# Patient Record
Sex: Male | Born: 1970 | Race: White | Hispanic: No | Marital: Married | Smoking: Never smoker
Health system: Southern US, Community
[De-identification: ages and names within clinical notes are randomized; demographics above are authoritative.]

## PROBLEM LIST (undated history)

## (undated) ENCOUNTER — Emergency Department (HOSPITAL_COMMUNITY): Admission: EM | Payer: Self-pay | Source: Home / Self Care

---

## 2011-12-13 ENCOUNTER — Ambulatory Visit (INDEPENDENT_AMBULATORY_CARE_PROVIDER_SITE_OTHER): Payer: BC Managed Care – PPO | Admitting: Urology

## 2011-12-13 DIAGNOSIS — N509 Disorder of male genital organs, unspecified: Secondary | ICD-10-CM

## 2012-02-27 ENCOUNTER — Other Ambulatory Visit: Payer: Self-pay | Admitting: Family Medicine

## 2012-02-27 ENCOUNTER — Other Ambulatory Visit: Payer: BC Managed Care – PPO

## 2012-02-27 DIAGNOSIS — M542 Cervicalgia: Secondary | ICD-10-CM

## 2012-02-28 ENCOUNTER — Ambulatory Visit
Admission: RE | Admit: 2012-02-28 | Discharge: 2012-02-28 | Disposition: A | Discharge status: Home / Self Care | Payer: BC Managed Care – PPO | Source: Ambulatory Visit | Attending: Family Medicine | Admitting: Family Medicine

## 2012-02-28 DIAGNOSIS — M542 Cervicalgia: Secondary | ICD-10-CM

## 2013-11-19 ENCOUNTER — Ambulatory Visit (INDEPENDENT_AMBULATORY_CARE_PROVIDER_SITE_OTHER): Payer: BC Managed Care – PPO | Admitting: Family Medicine

## 2013-11-19 ENCOUNTER — Encounter: Payer: Self-pay | Admitting: Family Medicine

## 2013-11-19 VITALS — BP 117/81 | Ht 72.0 in | Wt 165.0 lb

## 2013-11-19 DIAGNOSIS — M25579 Pain in unspecified ankle and joints of unspecified foot: Secondary | ICD-10-CM

## 2013-11-19 DIAGNOSIS — S93409A Sprain of unspecified ligament of unspecified ankle, initial encounter: Secondary | ICD-10-CM

## 2013-11-19 NOTE — Patient Instructions (Signed)
Thank you for coming in today  We are consider that you may have an osteochondral lesion of the talar dome 1. Get ankle xrays 2. Get MRI   We will call with MRI

## 2013-11-19 NOTE — Progress Notes (Signed)
   Subjective:    Patient ID: Scott Romero, male    DOB: February 01, 1971, 43 y.o.   MRN: [id number]  HPI Right ankle pain. About 2 months ago he stepped in a hole, sprained his ankle. Has had previous multiple ankle sprains which he has successfully recapped, this ankle sprain has not returned to baseline. He's been the usual exercises. Still having pain particularly on any type of running or jogging. Some pain with walking and weightbearing. Pain is deep seated within the ankle. Not aware of any swelling.   Review of Systems No fever, no swelling or erythema or warmth of the ankle joint. No numbness or tingling in his foot or toes.    Objective:   Physical Exam Vital signs are reviewed GENERAL: Well-developed male no acute distress to ANKLE: Right. Mild tenderness to palpation on inversion and eversion and the pain seems to be within the subtalar joint. Inversion and eversion strength is 4. Anterior drawer is normal. There is no effusion. ; No significant ankle effusion. HEF in perineal tendons appear to be intact.       Assessment & Plan:  Ankle pain. Given mechanism of injury and lack of improvement with adequate conservative therapy I am concerned that he has costochondral injury. We'll get x-rays and ultimately will likely need MRI.

## 2013-11-24 ENCOUNTER — Ambulatory Visit
Admission: RE | Admit: 2013-11-24 | Discharge: 2013-11-24 | Disposition: A | Discharge status: Home / Self Care | Payer: BC Managed Care – PPO | Source: Ambulatory Visit | Attending: Family Medicine | Admitting: Family Medicine

## 2013-11-24 DIAGNOSIS — S93409A Sprain of unspecified ligament of unspecified ankle, initial encounter: Secondary | ICD-10-CM

## 2013-11-24 DIAGNOSIS — M25579 Pain in unspecified ankle and joints of unspecified foot: Secondary | ICD-10-CM

## 2013-11-25 ENCOUNTER — Telehealth: Payer: Self-pay | Admitting: *Deleted

## 2013-11-25 NOTE — Telephone Encounter (Signed)
Message copied by Daphine DeutscherMARTIN, AMY C on Thu Nov 25, 2013  3:01 PM ------      Message from: Daine GipVOSS, ERIN L      Created: Wed Nov 24, 2013  6:16 PM       Please notify patient that xray is normal. Recommend MRI as scheudled. Thanks ------

## 2013-11-25 NOTE — Telephone Encounter (Signed)
Pt notified of x-ray results, scheduled for MRI 12/01/13.

## 2013-11-26 ENCOUNTER — Other Ambulatory Visit: Payer: BC Managed Care – PPO

## 2013-12-01 ENCOUNTER — Inpatient Hospital Stay: Admission: RE | Admit: 2013-12-01 | Payer: BC Managed Care – PPO | Source: Ambulatory Visit

## 2018-03-08 ENCOUNTER — Inpatient Hospital Stay (HOSPITAL_COMMUNITY)
Admission: EM | Admit: 2018-03-08 | Discharge: 2018-03-12 | DRG: 501 | Disposition: A | Discharge status: Home / Self Care | Payer: BLUE CROSS/BLUE SHIELD | Attending: General Surgery | Admitting: General Surgery

## 2018-03-08 ENCOUNTER — Encounter (HOSPITAL_COMMUNITY): Admission: EM | Disposition: A | Discharge status: Home / Self Care | Payer: Self-pay | Source: Home / Self Care

## 2018-03-08 ENCOUNTER — Emergency Department (HOSPITAL_COMMUNITY): Payer: BLUE CROSS/BLUE SHIELD

## 2018-03-08 ENCOUNTER — Inpatient Hospital Stay (HOSPITAL_COMMUNITY): Payer: BLUE CROSS/BLUE SHIELD | Admitting: Anesthesiology

## 2018-03-08 ENCOUNTER — Encounter (HOSPITAL_COMMUNITY): Payer: Self-pay | Admitting: Anesthesiology

## 2018-03-08 DIAGNOSIS — S0181XA Laceration without foreign body of other part of head, initial encounter: Secondary | ICD-10-CM | POA: Diagnosis present

## 2018-03-08 DIAGNOSIS — S52002C Unspecified fracture of upper end of left ulna, initial encounter for open fracture type IIIA, IIIB, or IIIC: Secondary | ICD-10-CM | POA: Diagnosis present

## 2018-03-08 DIAGNOSIS — W28XXXA Contact with powered lawn mower, initial encounter: Secondary | ICD-10-CM

## 2018-03-08 DIAGNOSIS — D62 Acute posthemorrhagic anemia: Secondary | ICD-10-CM | POA: Diagnosis present

## 2018-03-08 DIAGNOSIS — S52101C Unspecified fracture of upper end of right radius, initial encounter for open fracture type IIIA, IIIB, or IIIC: Principal | ICD-10-CM | POA: Diagnosis present

## 2018-03-08 DIAGNOSIS — E872 Acidosis: Secondary | ICD-10-CM | POA: Diagnosis present

## 2018-03-08 DIAGNOSIS — Z09 Encounter for follow-up examination after completed treatment for conditions other than malignant neoplasm: Secondary | ICD-10-CM

## 2018-03-08 DIAGNOSIS — M25531 Pain in right wrist: Secondary | ICD-10-CM

## 2018-03-08 DIAGNOSIS — S5292XC Unspecified fracture of left forearm, initial encounter for open fracture type IIIA, IIIB, or IIIC: Secondary | ICD-10-CM

## 2018-03-08 DIAGNOSIS — S5292XS Unspecified fracture of left forearm, sequela: Secondary | ICD-10-CM

## 2018-03-08 HISTORY — PX: I & D EXTREMITY: SHX5045

## 2018-03-08 HISTORY — PX: OPEN REDUCTION INTERNAL FIXATION (ORIF) DISTAL RADIAL FRACTURE: SHX5989

## 2018-03-08 LAB — COMPREHENSIVE METABOLIC PANEL
ALT: 32 U/L (ref 0–44)
AST: 36 U/L (ref 15–41)
Albumin: 4.1 g/dL (ref 3.5–5.0)
Alkaline Phosphatase: 49 U/L (ref 38–126)
Anion gap: 11 (ref 5–15)
BUN: 12 mg/dL (ref 6–20)
CHLORIDE: 107 mmol/L (ref 98–111)
CO2: 23 mmol/L (ref 22–32)
CREATININE: 1.08 mg/dL (ref 0.61–1.24)
Calcium: 9 mg/dL (ref 8.9–10.3)
GFR calc Af Amer: >60 mL/min (ref >60)
GFR calc non Af Amer: >60 mL/min (ref >60)
Glucose, Bld: 98 mg/dL (ref 70–99)
Potassium: 3.8 mmol/L (ref 3.5–5.1)
SODIUM: 141 mmol/L (ref 135–145)
Total Bilirubin: 0.6 mg/dL (ref 0.3–1.2)
Total Protein: 6.6 g/dL (ref 6.5–8.1)

## 2018-03-08 LAB — CBC
HCT: 40.6 % (ref 39.0–52.0)
Hemoglobin: 13.3 g/dL (ref 13.0–17.0)
MCH: 30.5 pg (ref 26.0–34.0)
MCHC: 32.8 g/dL (ref 30.0–36.0)
MCV: 93.1 fL (ref 78.0–100.0)
PLATELETS: 317 10*3/uL (ref 150–400)
RBC: 4.36 MIL/uL (ref 4.22–5.81)
RDW: 11.9 % (ref 11.5–15.5)
WBC: 9.7 10*3/uL (ref 4.0–10.5)

## 2018-03-08 LAB — I-STAT CHEM 8, ED
BUN: 14 mg/dL (ref 6–20)
CREATININE: 1.4 mg/dL — AB (ref 0.61–1.24)
Calcium, Ion: 1.05 mmol/L — ABNORMAL LOW (ref 1.15–1.40)
Chloride: 108 mmol/L (ref 98–111)
Glucose, Bld: 97 mg/dL (ref 70–99)
HCT: 39 % (ref 39.0–52.0)
Hemoglobin: 13.3 g/dL (ref 13.0–17.0)
Potassium: 3.9 mmol/L (ref 3.5–5.1)
Sodium: 142 mmol/L (ref 135–145)
TCO2: 24 mmol/L (ref 22–32)

## 2018-03-08 LAB — I-STAT CG4 LACTIC ACID, ED: Lactic Acid, Venous: 2.51 mmol/L (ref 0.5–1.9)

## 2018-03-08 LAB — CDS SEROLOGY

## 2018-03-08 LAB — ETHANOL: Alcohol, Ethyl (B): 164 mg/dL — ABNORMAL HIGH (ref <10)

## 2018-03-08 SURGERY — IRRIGATION AND DEBRIDEMENT EXTREMITY
Anesthesia: General | Laterality: Left

## 2018-03-08 SURGERY — IRRIGATION AND DEBRIDEMENT EXTREMITY
Anesthesia: General | Site: Arm Lower | Laterality: Left

## 2018-03-08 MED ORDER — CEFAZOLIN SODIUM-DEXTROSE 1-4 GM/50ML-% IV SOLN
INTRAVENOUS | Status: DC | PRN
  Administered 2018-03-08: 1 g via INTRAVENOUS

## 2018-03-08 MED ORDER — MIDAZOLAM HCL 5 MG/5ML IJ SOLN
INTRAMUSCULAR | Status: DC | PRN
  Administered 2018-03-08 (×2): 1 mg via INTRAVENOUS

## 2018-03-08 MED ORDER — SODIUM CHLORIDE 0.9 % IR SOLN
Status: DC | PRN
  Administered 2018-03-08 (×3): 3000 mL

## 2018-03-08 MED ORDER — LIDOCAINE 2% (20 MG/ML) 5 ML SYRINGE
INTRAMUSCULAR | Status: AC
  Filled 2018-03-08: qty 5

## 2018-03-08 MED ORDER — HYDROMORPHONE HCL 1 MG/ML IJ SOLN
0.2500 mg | INTRAMUSCULAR | Status: DC | PRN
  Administered 2018-03-09 (×3): 0.5 mg via INTRAVENOUS

## 2018-03-08 MED ORDER — ARTIFICIAL TEARS OPHTHALMIC OINT
TOPICAL_OINTMENT | OPHTHALMIC | Status: AC
  Filled 2018-03-08: qty 3.5

## 2018-03-08 MED ORDER — PROPOFOL 10 MG/ML IV BOLUS
INTRAVENOUS | Status: AC
  Filled 2018-03-08: qty 20

## 2018-03-08 MED ORDER — SODIUM CHLORIDE 0.9 % IV SOLN
INTRAVENOUS | Status: DC | PRN
  Administered 2018-03-08 (×2): via INTRAVENOUS

## 2018-03-08 MED ORDER — ONDANSETRON HCL 4 MG/2ML IJ SOLN
INTRAMUSCULAR | Status: DC | PRN
  Administered 2018-03-08: 4 mg via INTRAVENOUS

## 2018-03-08 MED ORDER — ROCURONIUM BROMIDE 10 MG/ML (PF) SYRINGE
PREFILLED_SYRINGE | INTRAVENOUS | Status: AC
  Filled 2018-03-08: qty 10

## 2018-03-08 MED ORDER — ONDANSETRON HCL 4 MG/2ML IJ SOLN
4.0000 mg | Freq: Once | INTRAMUSCULAR | Status: AC | PRN
  Administered 2018-03-09: 4 mg via INTRAVENOUS

## 2018-03-08 MED ORDER — TETANUS-DIPHTH-ACELL PERTUSSIS 5-2.5-18.5 LF-MCG/0.5 IM SUSP
INTRAMUSCULAR | Status: AC
  Administered 2018-03-08: 0.5 mL via INTRAMUSCULAR
  Filled 2018-03-08: qty 0.5

## 2018-03-08 MED ORDER — FENTANYL CITRATE (PF) 100 MCG/2ML IJ SOLN
INTRAMUSCULAR | Status: AC
Start: 2018-03-08 — End: 2018-03-09
  Filled 2018-03-08: qty 2

## 2018-03-08 MED ORDER — LIDOCAINE 2% (20 MG/ML) 5 ML SYRINGE
INTRAMUSCULAR | Status: DC | PRN
  Administered 2018-03-08: 100 mg via INTRAVENOUS

## 2018-03-08 MED ORDER — ARTIFICIAL TEARS OPHTHALMIC OINT
TOPICAL_OINTMENT | OPHTHALMIC | Status: DC | PRN
  Administered 2018-03-08: 1 via OPHTHALMIC

## 2018-03-08 MED ORDER — PROPOFOL 10 MG/ML IV BOLUS
INTRAVENOUS | Status: DC | PRN
  Administered 2018-03-08: 100 mg via INTRAVENOUS

## 2018-03-08 MED ORDER — FENTANYL CITRATE (PF) 250 MCG/5ML IJ SOLN
INTRAMUSCULAR | Status: AC
  Filled 2018-03-08: qty 5

## 2018-03-08 MED ORDER — CEFAZOLIN SODIUM-DEXTROSE 1-4 GM/50ML-% IV SOLN
1.0000 g | Freq: Once | INTRAVENOUS | Status: AC
Start: 2018-03-08 — End: 2018-03-08
  Administered 2018-03-08: 1 g via INTRAVENOUS

## 2018-03-08 MED ORDER — SUCCINYLCHOLINE CHLORIDE 200 MG/10ML IV SOSY
PREFILLED_SYRINGE | INTRAVENOUS | Status: AC
  Filled 2018-03-08: qty 10

## 2018-03-08 MED ORDER — FENTANYL CITRATE (PF) 100 MCG/2ML IJ SOLN
50.0000 ug | Freq: Once | INTRAMUSCULAR | Status: AC
  Administered 2018-03-08: 50 ug via INTRAVENOUS

## 2018-03-08 MED ORDER — ROCURONIUM BROMIDE 50 MG/5ML IV SOSY
PREFILLED_SYRINGE | INTRAVENOUS | Status: DC | PRN
  Administered 2018-03-08 (×2): 20 mg via INTRAVENOUS
  Administered 2018-03-08: 30 mg via INTRAVENOUS
  Administered 2018-03-08: 10 mg via INTRAVENOUS
  Administered 2018-03-08: 20 mg via INTRAVENOUS

## 2018-03-08 MED ORDER — SUCCINYLCHOLINE CHLORIDE 20 MG/ML IJ SOLN
INTRAMUSCULAR | Status: DC | PRN
  Administered 2018-03-08: 140 mg via INTRAVENOUS

## 2018-03-08 MED ORDER — MIDAZOLAM HCL 2 MG/2ML IJ SOLN
INTRAMUSCULAR | Status: AC
  Filled 2018-03-08: qty 2

## 2018-03-08 MED ORDER — SUGAMMADEX SODIUM 200 MG/2ML IV SOLN
INTRAVENOUS | Status: DC | PRN
  Administered 2018-03-08: 150 mg via INTRAVENOUS

## 2018-03-08 MED ORDER — SODIUM CHLORIDE 0.9 % IV SOLN
INTRAVENOUS | Status: DC
  Administered 2018-03-09: 100 mL/h via INTRAVENOUS
  Administered 2018-03-09 – 2018-03-11 (×2): via INTRAVENOUS

## 2018-03-08 MED ORDER — ONDANSETRON HCL 4 MG/2ML IJ SOLN
4.0000 mg | Freq: Once | INTRAMUSCULAR | Status: AC
  Administered 2018-03-08: 4 mg via INTRAVENOUS

## 2018-03-08 MED ORDER — IOPAMIDOL (ISOVUE-370) INJECTION 76%
100.0000 mL | Freq: Once | INTRAVENOUS | Status: AC | PRN
  Administered 2018-03-08: 100 mL via INTRAVENOUS

## 2018-03-08 MED ORDER — EPHEDRINE SULFATE 50 MG/ML IJ SOLN
INTRAMUSCULAR | Status: DC | PRN
  Administered 2018-03-08 (×2): 5 mg via INTRAVENOUS

## 2018-03-08 MED ORDER — CEFAZOLIN SODIUM 1 G IJ SOLR
INTRAMUSCULAR | Status: AC
  Filled 2018-03-08: qty 10

## 2018-03-08 MED ORDER — LIDOCAINE-EPINEPHRINE (PF) 2 %-1:200000 IJ SOLN
10.0000 mL | Freq: Once | INTRAMUSCULAR | Status: AC
  Administered 2018-03-08: 19:00:00
  Filled 2018-03-08: qty 20

## 2018-03-08 MED ORDER — PHENYLEPHRINE HCL 10 MG/ML IJ SOLN
INTRAMUSCULAR | Status: DC | PRN
  Administered 2018-03-08: 25 ug/min via INTRAVENOUS

## 2018-03-08 MED ORDER — FENTANYL CITRATE (PF) 100 MCG/2ML IJ SOLN
INTRAMUSCULAR | Status: DC | PRN
  Administered 2018-03-08: 100 ug via INTRAVENOUS
  Administered 2018-03-08 (×4): 50 ug via INTRAVENOUS
  Administered 2018-03-08: 25 ug via INTRAVENOUS
  Administered 2018-03-08: 50 ug via INTRAVENOUS

## 2018-03-08 MED ORDER — 0.9 % SODIUM CHLORIDE (POUR BTL) OPTIME
TOPICAL | Status: DC | PRN
  Administered 2018-03-08: 1000 mL

## 2018-03-08 MED ORDER — CEFAZOLIN SODIUM-DEXTROSE 2-4 GM/100ML-% IV SOLN
INTRAVENOUS | Status: AC
  Filled 2018-03-08: qty 100

## 2018-03-08 MED ORDER — ONDANSETRON HCL 4 MG/2ML IJ SOLN
INTRAMUSCULAR | Status: AC
  Filled 2018-03-08: qty 2

## 2018-03-08 MED ORDER — SUGAMMADEX SODIUM 500 MG/5ML IV SOLN
INTRAVENOUS | Status: AC
  Filled 2018-03-08: qty 5

## 2018-03-08 MED ORDER — ONDANSETRON HCL 4 MG/2ML IJ SOLN: 4.0000 mg | Freq: Once | INTRAMUSCULAR | Status: DC | PRN

## 2018-03-08 MED ORDER — EPHEDRINE 5 MG/ML INJ
INTRAVENOUS | Status: AC
  Filled 2018-03-08: qty 10

## 2018-03-08 MED ORDER — TETANUS-DIPHTH-ACELL PERTUSSIS 5-2.5-18.5 LF-MCG/0.5 IM SUSP
0.5000 mL | Freq: Once | INTRAMUSCULAR | Status: AC
  Administered 2018-03-08: 0.5 mL via INTRAMUSCULAR

## 2018-03-08 MED ORDER — HYDROMORPHONE HCL 1 MG/ML IJ SOLN: 0.2500 mg | INTRAMUSCULAR | Status: DC | PRN

## 2018-03-08 MED ORDER — MEPERIDINE HCL 50 MG/ML IJ SOLN: 6.2500 mg | INTRAMUSCULAR | Status: DC | PRN

## 2018-03-08 SURGICAL SUPPLY — 60 items
BANDAGE ACE 4X5 VEL STRL LF (GAUZE/BANDAGES/DRESSINGS) ×4 IMPLANT
BANDAGE ESMARK 6X9 LF (GAUZE/BANDAGES/DRESSINGS) ×2 IMPLANT
BIT DRILL 2.5X2.75 QC CALB (BIT) ×4 IMPLANT
BIT DRILL 3.5X5.5 QC CALB (BIT) ×4 IMPLANT
BIT DRILL CALIBRATED 2.7 (BIT) ×3 IMPLANT
BIT DRILL CALIBRATED 2.7MM (BIT) ×1
BNDG ESMARK 6X9 LF (GAUZE/BANDAGES/DRESSINGS) ×4
CORD BIPOLAR FORCEPS 12FT (ELECTRODE) ×4 IMPLANT
COVER SURGICAL LIGHT HANDLE (MISCELLANEOUS) ×4 IMPLANT
DRAPE ORTHO SPLIT 77X108 STRL (DRAPES) ×2
DRAPE SURG ORHT 6 SPLT 77X108 (DRAPES) ×2 IMPLANT
ELECT REM PT RETURN 9FT ADLT (ELECTROSURGICAL) ×4
ELECTRODE REM PT RTRN 9FT ADLT (ELECTROSURGICAL) ×2 IMPLANT
GAUZE SPONGE 4X4 12PLY STRL LF (GAUZE/BANDAGES/DRESSINGS) ×4 IMPLANT
GAUZE XEROFORM 5X9 LF (GAUZE/BANDAGES/DRESSINGS) ×4 IMPLANT
GLOVE BIOGEL PI IND STRL 8 (GLOVE) ×4 IMPLANT
GLOVE BIOGEL PI INDICATOR 8 (GLOVE) ×4
GLOVE ECLIPSE 7.5 STRL STRAW (GLOVE) ×8 IMPLANT
GLOVE ECLIPSE 8.0 STRL XLNG CF (GLOVE) ×8 IMPLANT
GOWN STRL REUS W/ TWL LRG LVL3 (GOWN DISPOSABLE) ×4 IMPLANT
GOWN STRL REUS W/ TWL XL LVL3 (GOWN DISPOSABLE) ×2 IMPLANT
GOWN STRL REUS W/TWL LRG LVL3 (GOWN DISPOSABLE) ×4
GOWN STRL REUS W/TWL XL LVL3 (GOWN DISPOSABLE) ×2
KIT BASIN OR (CUSTOM PROCEDURE TRAY) ×4 IMPLANT
KIT TURNOVER KIT B (KITS) ×4 IMPLANT
MANIFOLD NEPTUNE II (INSTRUMENTS) ×4 IMPLANT
NS IRRIG 1000ML POUR BTL (IV SOLUTION) ×4 IMPLANT
PACK ORTHO EXTREMITY (CUSTOM PROCEDURE TRAY) ×4 IMPLANT
PAD ABD 8X10 STRL (GAUZE/BANDAGES/DRESSINGS) ×4 IMPLANT
PAD ARMBOARD 7.5X6 YLW CONV (MISCELLANEOUS) ×8 IMPLANT
PAD CAST 4YDX4 CTTN HI CHSV (CAST SUPPLIES) ×2 IMPLANT
PADDING CAST COTTON 4X4 STRL (CAST SUPPLIES) ×2
PLATE LOCK COMP 6H FOOT (Plate) ×4 IMPLANT
PLATE LOCK COMP 7H FOOT (Plate) ×4 IMPLANT
SCREW CORTICAL 3.5MM  16MM (Screw) ×2 IMPLANT
SCREW CORTICAL 3.5MM  20MM (Screw) ×10 IMPLANT
SCREW CORTICAL 3.5MM 16MM (Screw) ×2 IMPLANT
SCREW CORTICAL 3.5MM 18MM (Screw) ×16 IMPLANT
SCREW CORTICAL 3.5MM 20MM (Screw) ×10 IMPLANT
SCREW LOCK CORT STAR 3.5X12 (Screw) ×4 IMPLANT
SCREW LOCK CORT STAR 3.5X18 (Screw) ×4 IMPLANT
SET IRRIG Y TYPE TUR BLADDER L (SET/KITS/TRAYS/PACK) ×4 IMPLANT
SPLINT PLASTER CAST XFAST 5X30 (CAST SUPPLIES) ×2 IMPLANT
SPLINT PLASTER XFAST SET 5X30 (CAST SUPPLIES) ×2
SPONGE LAP 18X18 X RAY DECT (DISPOSABLE) ×4 IMPLANT
STOCKINETTE IMPERVIOUS LG (DRAPES) ×4 IMPLANT
SUCTION FRAZIER HANDLE 10FR (MISCELLANEOUS) ×2
SUCTION TUBE FRAZIER 10FR DISP (MISCELLANEOUS) ×2 IMPLANT
SUT ETHILON 2 0 FS 18 (SUTURE) ×8 IMPLANT
SUT ETHILON 3 0 PS 1 (SUTURE) ×16 IMPLANT
SUT MNCRL AB 4-0 PS2 18 (SUTURE) ×4 IMPLANT
SUT VIC AB 0 CT1 27 (SUTURE) ×2
SUT VIC AB 0 CT1 27XBRD ANBCTR (SUTURE) ×2 IMPLANT
SUT VIC AB 3-0 SH 18 (SUTURE) ×8 IMPLANT
TOWEL OR 17X24 6PK STRL BLUE (TOWEL DISPOSABLE) ×4 IMPLANT
TOWEL OR 17X26 10 PK STRL BLUE (TOWEL DISPOSABLE) ×4 IMPLANT
TUBE CONNECTING 12'X1/4 (SUCTIONS) ×3
TUBE CONNECTING 12X1/4 (SUCTIONS) ×9 IMPLANT
UNDERPAD 30X30 (UNDERPADS AND DIAPERS) ×4 IMPLANT
YANKAUER SUCT BULB TIP NO VENT (SUCTIONS) ×12 IMPLANT
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
## 2018-03-08 NOTE — ED Notes (Signed)
Pt taken to CT accompanied by RN Huntley DecSara.
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
## 2018-03-08 NOTE — Transfer of Care (Signed)
Immediate Anesthesia Transfer of Care Note  Patient: Scott Romero  Procedure(s) Performed: IRRIGATION AND DEBRIDEMENT EXTREMITY (Left Arm Lower) OPEN REDUCTION INTERNAL FIXATION (ORIF) DISTAL RADIAL  and ulner FRACTURE  Patient Location: PACU  Anesthesia Type:General  Level of Consciousness: drowsy  Airway & Oxygen Therapy: Patient Spontanous Breathing and Patient connected to nasal cannula oxygen  Post-op Assessment: Report given to RN and Post -op Vital signs reviewed and stable  Post vital signs: Reviewed and stable  Last Vitals:  Vitals Value Taken Time  BP 128/83 03/08/2018 11:52 PM  Temp    Pulse 102 03/08/2018 11:54 PM  Resp 18 03/08/2018 11:54 PM  SpO2 97 % 03/08/2018 11:54 PM  Vitals shown include unvalidated device data.  Last Pain:  Vitals:   03/08/18 1831  TempSrc: Temporal  PainSc:          Complications: No apparent anesthesia complications
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
## 2018-03-08 NOTE — ED Provider Notes (Signed)
[facility] EMERGENCY DEPARTMENT Provider Note   CSN: [id number] Arrival date & time: 03/08/18  1813     History   Chief Complaint Chief Complaint  Patient presents with  . Trauma    HPI Scott Romero is a 47 y.o. male.  HPI 47 year old left-hand-dominant male presents to the emergency department by EMS as a level 1 trauma after lawnmowing accident.  Reportedly flipped his zero turn lawnmower over on top of himself and was entrapped with lawnmower on his left arm.  States that friends were able to pry it off of him.  EMS was called and concerned for open fracture of the left upper extremity at the forearm.  Has had palpable radial pulse since injury.  No tourniquet applied.  Was placed in splint and transferred to this facility for further evaluation.  Patient also has a small forehead laceration. No LOC. Denies headache, neck pain, no numbness or weakness. Did drink 2 beers at 2pm. Last tetanus 59yr ago.   History reviewed. No pertinent past medical history.  Patient Active Problem List   Diagnosis Date Noted  . Open forearm fracture, left, sequela 03/08/2018       Home Medications    Prior to Admission medications   Medication Sig Start Date End Date Taking? Authorizing Provider  ibuprofen (ADVIL,MOTRIN) 200 MG tablet Take 600 mg by mouth every 6 (six) hours as needed for headache (pain).   Yes [provider]    Family History History reviewed. No pertinent family history.  Social History Social History   Tobacco Use  . Smoking status: Not on file  Substance Use Topics  . Alcohol use: Not on file  . Drug use: Not on file     Allergies   Codeine and Morphine and related   Review of Systems Review of Systems  Constitutional: Negative for chills and fever.  HENT: Negative for congestion.   Respiratory: Negative for cough and shortness of breath.   Cardiovascular: Negative for chest pain and leg swelling.  Gastrointestinal:  Negative for abdominal pain, diarrhea, nausea and vomiting.  Genitourinary: Negative for dysuria and hematuria.  Musculoskeletal: Positive for neck pain (has been hurting since picking up child yesterday). Negative for back pain.  Skin: Positive for wound (left arm large avulsion injury dorsal mid forearm ; forehead lac). Negative for rash.  Neurological: Negative for weakness, numbness and headaches.     Physical Exam Updated Vital Signs BP 129/76   Pulse 71   Temp (!) 96.8 F (36 C) (Temporal)   Resp 16   Ht 6' (1.829 m)   Wt 76.2 kg   SpO2 94%   BMI 22.78 kg/m   Physical Exam  Constitutional: No distress.  HENT:  Head: Normocephalic.  Stellate 1cm laceration above left eyebrow. No nasal septal hematoma.   Eyes: Pupils are equal, round, and reactive to light. Conjunctivae are normal. Right eye exhibits no discharge. Left eye exhibits no discharge.  Neck: Normal range of motion. No tracheal deviation present.  Cardiovascular: Regular rhythm, normal heart sounds and intact distal pulses.  No murmur heard. Pulmonary/Chest: Effort normal and breath sounds normal. No respiratory distress.  Abdominal: Soft. Bowel sounds are normal. He exhibits no distension. There is no tenderness.  Musculoskeletal: He exhibits tenderness and deformity (LUE). He exhibits no edema.  No midline CTL spine pain. Abrasion over left buttocks  Obvious deformity and 8x4cm avulsion injury with muscle exposure at proximal left forearm. Neurovasc intact distally with bounding radial pulse. No  palpable ulnar pulse. Moving all fingers. Sensation intact throughout all fingers.   Neurological: He is alert. He exhibits normal muscle tone.  Skin: Capillary refill takes less than 2 seconds. No rash noted. He is not diaphoretic.  Psychiatric: He has a normal mood and affect.     ED Treatments / Results  Labs (all labs ordered are listed, but only abnormal results are displayed) Labs Reviewed  ETHANOL -  Abnormal; Notable for the following components:      Result Value   Alcohol, Ethyl (B) 164 (*)    All other components within normal limits  I-STAT CHEM 8, ED - Abnormal; Notable for the following components:   Creatinine, Ser 1.40 (*)    Calcium, Ion 1.05 (*)    All other components within normal limits  I-STAT CG4 LACTIC ACID, ED - Abnormal; Notable for the following components:   Lactic Acid, Venous 2.51 (*)    All other components within normal limits  CDS SEROLOGY  COMPREHENSIVE METABOLIC PANEL  CBC  URINALYSIS, ROUTINE W REFLEX MICROSCOPIC  PREPARE FRESH FROZEN PLASMA  TYPE AND SCREEN  ABO/RH    EKG None  Radiology Dg Forearm Left  Result Date: 03/08/2018 CLINICAL DATA:  Pain after trauma EXAM: LEFT FOREARM - 2 VIEW COMPARISON:  None. FINDINGS: Fractures are identified in the proximal diaphyses of the radius and ulna. No other fractures noted. There is gauze over the arm. Multiple tiny foci of high attenuation in the soft tissues could be within the gauze, on the skin, or represent small foreign bodies. Recommend clinical correlation. IMPRESSION: 1. Fractures of the proximal radius and ulna with significant displacement. 2. Multiple foci of high attenuation over the soft tissues could be within the overlying dressings, on the skin, or small foreign bodies. Recommend clinical correlation. Electronically Signed   By: Gerome Sam III M.D   On: 03/08/2018 18:41   Ct Head Wo Contrast  Result Date: 03/08/2018 CLINICAL DATA:  Lawnmower accident. EXAM: CT HEAD WITHOUT CONTRAST CT CERVICAL SPINE WITHOUT CONTRAST TECHNIQUE: Multidetector CT imaging of the head and cervical spine was performed following the standard protocol without intravenous contrast. Multiplanar CT image reconstructions of the cervical spine were also generated. COMPARISON:  None. FINDINGS: CT HEAD FINDINGS Brain: No evidence of acute infarction, hemorrhage, hydrocephalus, extra-axial collection or mass lesion/mass  effect. 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 Vascular: No hyperdense vessel or unexpected calcification. Skull: Normal. Negative for fracture or focal lesion. Sinuses/Orbits: No acute finding. Other: Small left frontal scout laceration. CT CERVICAL SPINE FINDINGS Alignment: Normal. Skull base and vertebrae: No acute fracture. No primary bone lesion or focal pathologic process. Soft tissues and spinal canal: No prevertebral fluid or swelling. No visible canal hematoma. Disc levels:  Unremarkable Upper chest: Negative Other: 9 IMPRESSION: 1. No acute intracranial abnormality. 2. No acute abnormality of the cervical spine. 3. Left frontal scalp laceration Electronically Signed   By: Signa Kell M.D.   On: 03/08/2018 19:26   Ct Cervical Spine Wo Contrast  Result Date: 03/08/2018 CLINICAL DATA:  Lawnmower accident. EXAM: CT HEAD WITHOUT CONTRAST CT CERVICAL SPINE WITHOUT CONTRAST TECHNIQUE: Multidetector CT imaging of the head and cervical spine was performed following the standard protocol without intravenous contrast. Multiplanar CT image reconstructions of the cervical spine were also generated. COMPARISON:  None. FINDINGS: CT HEAD FINDINGS Brain: No evidence of acute infarction, hemorrhage, hydrocephalus, extra-axial collection or mass lesion/mass effect. CSF structure within the posterior fossa compatible with mega cisterna  magna. Vascular: No hyperdense vessel or unexpected calcification. Skull: Normal. Negative for fracture or focal lesion. Sinuses/Orbits: No acute finding. Other: Small left frontal scout laceration. CT CERVICAL SPINE FINDINGS Alignment: Normal. Skull base and vertebrae: No acute fracture. No primary bone lesion or focal pathologic process. Soft tissues and spinal canal: No prevertebral fluid or swelling. No visible canal hematoma. Disc levels:  Unremarkable Upper chest: Negative Other: 9 IMPRESSION: 1. No acute intracranial abnormality. 2. No acute  abnormality of the cervical spine. 3. Left frontal scalp laceration Electronically Signed   By: Signa Kell M.D.   On: 03/08/2018 19:26   Ct Angio Up Extrem Left W &/or Wo Contast  Result Date: 03/08/2018 CLINICAL DATA:  Recent lawnmower accident with significant laceration of the left arm with ulnar and radial fractures EXAM: CT ANGIOGRAPHY OF THE LEFT UPPEREXTREMITY TECHNIQUE: Multidetector CT imaging of the left upperwas performed using the standard protocol during bolus administration of intravenous contrast. Multiplanar CT image reconstructions and MIPs were obtained to evaluate the vascular anatomy. CONTRAST:  ISOVUE-370 IOPAMIDOL (ISOVUE-370) INJECTION 76% COMPARISON:  None. FINDINGS: Vascular: The thoracic aorta and its branches are well visualized and widely patent. No significant atherosclerotic changes are noted. The pulmonary artery as visualized is within normal limits. The left subclavian artery and axillary artery as well as the brachial artery are well visualized and within normal limits. The brachial trifurcation is patent. The radial artery is well visualized and extends through the forearm into the plantar arch without abnormality. No significant arterial narrowing or findings of dissection are seen. No active extravasation is noted. The ulnar artery is also well visualized and demonstrates a normal appearance with some tortuosity in the distal forearm although no a occlusion or significant spasm is seen. No active extravasation is noted. No rapidly expanding hematoma is seen. The interosseous artery is diminutive likely related to significant soft tissue edema and spasm. The vasculature of the chest and abdomen and pelvis are partially visualized due to the positioning of the arm adjacent to the body. Abdominal aorta and its branches are within normal limits. No focal abnormality is seen. Nonvascular: Visualized lungs are well aerated without evidence of focal infiltrate or sizable  effusion. No pneumothorax is noted. The osseous structures demonstrate the previously described proximal shaft fractures of the radius and ulna. No other bony abnormality is seen. The visualize ribcage is unremarkable. The esophagus is within normal limits. No hilar or mediastinal adenopathy is noted. The thoracic inlet is unremarkable. Limited visualization of the abdominal visceral organs shows no focal abnormality. Review of the MIP images confirms the above findings. IMPRESSION: The vasculature of the left upper extremity is within normal limits with the exception of some spasm in the interosseous artery consistent with the recent radial and ulnar fractures and local soft tissue edema. No focal abnormality of the radial or ulnar artery is seen. No evidence of hematoma or active extravasation. The remainder of the visualized structures to include parts of the chest, abdomen and pelvis reveal no focal abnormality. Electronically Signed   By: Alcide Clever M.D.   On: 03/08/2018 19:38   Dg Chest Portable 1 View  Result Date: 03/08/2018 CLINICAL DATA:  Trauma.  Pain. EXAM: PORTABLE CHEST 1 VIEW COMPARISON:  None. FINDINGS: The heart size and mediastinal contours are within normal limits. Both lungs are clear. The visualized skeletal structures are unremarkable. IMPRESSION: No active disease. Electronically Signed   By: Gerome Sam III M.D   On: 03/08/2018 18:42  Procedures .Marland Kitchen.Laceration Repair Date/Time: 03/08/2018 9:19 PM Performed by: Rigoberto Noelickens, Bird Tailor, MD Authorized by: Rigoberto Noelickens, Roemello Speyer, MD   Consent:    Consent obtained:  Verbal   Consent given by:  Patient   Risks discussed:  Retained foreign body, vascular damage and pain   Alternatives discussed:  Observation Anesthesia (see MAR for exact dosages):    Anesthesia method:  Local infiltration   Local anesthetic:  Lidocaine 1% WITH epi Laceration details:    Location:  Face   Face location:  Forehead   Length (cm):  1 Repair type:    Repair  type:  Simple Pre-procedure details:    Preparation:  Patient was prepped and draped in usual sterile fashion Exploration:    Wound exploration comment:  No FB. no galea tear or exposure. no depressible skull injury Treatment:    Area cleansed with:  Saline   Amount of cleaning:  Standard   Irrigation solution:  Sterile saline   Irrigation volume:  200   Irrigation method:  Syringe   Visualized foreign bodies/material removed: no   Skin repair:    Repair method:  Sutures   Suture size:  6-0   Wound skin closure material used: vicryl.   Suture technique:  Simple interrupted   Number of sutures:  3 Approximation:    Approximation:  Close Post-procedure details:    Dressing:  Open (no dressing)   Patient tolerance of procedure:  Tolerated well, no immediate complications   (including critical care time)  Medications Ordered in ED Medications  ceFAZolin (ANCEF) 2-4 GM/100ML-% IVPB (has no administration in time range)  HYDROmorphone (DILAUDID) injection 0.25-0.5 mg (has no administration in time range)  meperidine (DEMEROL) injection 6.25-12.5 mg (has no administration in time range)  ondansetron (ZOFRAN) injection 4 mg (has no administration in time range)  sodium chloride irrigation 0.9 % (3,000 mLs Irrigation Given 03/08/18 2041)  0.9 % irrigation (POUR BTL) (1,000 mLs Other Given 03/08/18 2000)  ceFAZolin (ANCEF) IVPB 1 g/50 mL premix (0 g Intravenous Stopped 03/08/18 1859)  Tdap (BOOSTRIX) injection 0.5 mL (0.5 mLs Intramuscular Given 03/08/18 1828)  fentaNYL (SUBLIMAZE) injection 50 mcg (50 mcg Intravenous Given 03/08/18 1828)  ondansetron (ZOFRAN) injection 4 mg (4 mg Intravenous Given 03/08/18 1827)  iopamidol (ISOVUE-370) 76 % injection 100 mL (100 mLs Intravenous Contrast Given 03/08/18 1900)  lidocaine-EPINEPHrine (XYLOCAINE W/EPI) 2 %-1:200000 (PF) injection 10 mL ( Infiltration Given 03/08/18 1900)  fentaNYL (SUBLIMAZE) injection 50 mcg (50 mcg Intravenous Given 03/08/18 1901)      Initial Impression / Assessment and Plan / ED Course  I have reviewed the triage vital signs and the nursing notes.  Pertinent labs & imaging results that were available during my care of the patient were reviewed by me and considered in my medical decision making (see chart for details).    47 year old left-hand-dominant male presents to the emergency department by EMS as a level 1 trauma after lawnmowing accident.  Patient afebrile, hemodynamically stable at presentation.  Level 1 trauma given mechanism and open extremity injury.  Trauma team present in the ED.  Thoracic and abdominal exam is reassuring with no tenderness or crepitus.  No midline C/T/L-spine tenderness.  Does have open avulsion injury at the left forearm as detailed above that appears to be neurovascularly intact.  Is left hand dominant. Concern for open fracture. Ancef and tetanus given in ED. IV Fentanyl given for pain.  CXR with no acute cardiac or pulmonary abnormality.  No pneumothorax.  No rib fractures or  effusions.  X-ray of the left forearm shows proximal fractures of both radius and ulna with significant displacement.  CT angiogram of the left upper extremity with patent vasculature.  Does show some areas of spasm.  No active extravasation.  CT head and cervical spine obtained with no acute intrarenal abnormality.  No cervical spine fractures.  Denies of the thoracic or abdominal imaging is indicated at this time.  Remainder of extremities atraumatic and nontender to palpation.  Was able to ambulate at scene.  Orthopedics consulted and evaluated patient here in the emergency department and will take to the operating room for washout and repair of open fracture.  Patient stable in the ED with no acute events.  Forehead laceration closed as detailed above.  Stable time of transfer to OR.  Case and plan of care discussed with Dr. Particia Nearing.   Final Clinical Impressions(s) / ED Diagnoses   Final diagnoses:  Type III  open fracture of left forearm, initial encounter  Laceration of forehead, initial encounter    ED Discharge Orders    None       Rigoberto Noel, MD 03/08/18 2127    Jacalyn Lefevre, MD 03/08/18 2232

## 2018-03-08 NOTE — Anesthesia Preprocedure Evaluation (Addendum)
Anesthesia Evaluation  Patient identified by MRN, date of birth, ID band Patient awake    Reviewed: Allergy & Precautions, NPO status , Patient's Chart, lab work & pertinent test results  Airway Mallampati: I  TM Distance: >3 FB Neck ROM: Full    Dental  (+) Teeth Intact, Dental Advisory Given   Pulmonary    Pulmonary exam normal        Cardiovascular Normal cardiovascular exam     Neuro/Psych    GI/Hepatic   Endo/Other    Renal/GU      Musculoskeletal   Abdominal   Peds  Hematology   Anesthesia Other Findings   Reproductive/Obstetrics                            Anesthesia Physical Anesthesia Plan  ASA: III and emergent  Anesthesia Plan: General   Post-op Pain Management:    Induction: Intravenous, Rapid sequence and Cricoid pressure planned  PONV Risk Score and Plan: 2 and Ondansetron and Midazolam  Airway Management Planned: Oral ETT  Additional Equipment:   Intra-op Plan:   Post-operative Plan: Extubation in OR  Informed Consent: I have reviewed the patients History and Physical, chart, labs and discussed the procedure including the risks, benefits and alternatives for the proposed anesthesia with the patient or authorized representative who has indicated his/her understanding and acceptance.     Plan Discussed with: CRNA and Surgeon  Anesthesia Plan Comments:        Anesthesia Quick Evaluation

## 2018-03-08 NOTE — Anesthesia Procedure Notes (Addendum)
Procedure Name: Intubation Date/Time: 03/08/2018 8:02 PM Performed by: Edmonia CaprioAuston, Kayline Sheer M, CRNA Pre-anesthesia Checklist: Patient identified, Emergency Drugs available, Suction available, Patient being monitored and Timeout performed Patient Re-evaluated:Patient Re-evaluated prior to induction Oxygen Delivery Method: Circle system utilized Preoxygenation: Pre-oxygenation with 100% oxygen Induction Type: IV induction, Rapid sequence and Cricoid Pressure applied Laryngoscope Size: Miller and 2 Grade View: Grade I Tube type: Oral Tube size: 7.5 mm Number of attempts: 1 Airway Equipment and Method: Stylet Placement Confirmation: ETT inserted through vocal cords under direct vision,  positive ETCO2 and breath sounds checked- equal and bilateral Secured at: 23 cm Tube secured with: Tape Dental Injury: Teeth and Oropharynx as per pre-operative assessment

## 2018-03-09 ENCOUNTER — Encounter (HOSPITAL_COMMUNITY): Payer: Self-pay

## 2018-03-09 ENCOUNTER — Other Ambulatory Visit (HOSPITAL_COMMUNITY): Payer: BLUE CROSS/BLUE SHIELD

## 2018-03-09 ENCOUNTER — Inpatient Hospital Stay (HOSPITAL_COMMUNITY): Payer: BLUE CROSS/BLUE SHIELD

## 2018-03-09 ENCOUNTER — Other Ambulatory Visit: Payer: Self-pay

## 2018-03-09 LAB — TYPE AND SCREEN
ABO/RH(D): O POS
Antibody Screen: NEGATIVE
UNIT DIVISION: 0
Unit division: 0

## 2018-03-09 LAB — BASIC METABOLIC PANEL
Anion gap: 7 (ref 5–15)
BUN: 11 mg/dL (ref 6–20)
CHLORIDE: 109 mmol/L (ref 98–111)
CO2: 25 mmol/L (ref 22–32)
Calcium: 7.9 mg/dL — ABNORMAL LOW (ref 8.9–10.3)
Creatinine, Ser: 1.09 mg/dL (ref 0.61–1.24)
GFR calc Af Amer: >60 mL/min (ref >60)
GFR calc non Af Amer: >60 mL/min (ref >60)
GLUCOSE: 149 mg/dL — AB (ref 70–99)
POTASSIUM: 4.4 mmol/L (ref 3.5–5.1)
Sodium: 141 mmol/L (ref 135–145)

## 2018-03-09 LAB — CBC
HCT: 35.2 % — ABNORMAL LOW (ref 39.0–52.0)
Hemoglobin: 11.4 g/dL — ABNORMAL LOW (ref 13.0–17.0)
MCH: 30.6 pg (ref 26.0–34.0)
MCHC: 32.4 g/dL (ref 30.0–36.0)
MCV: 94.4 fL (ref 78.0–100.0)
Platelets: 253 K/uL (ref 150–400)
RBC: 3.73 MIL/uL — ABNORMAL LOW (ref 4.22–5.81)
RDW: 12.4 % (ref 11.5–15.5)
WBC: 16.3 K/uL — ABNORMAL HIGH (ref 4.0–10.5)

## 2018-03-09 LAB — BPAM FFP
Blood Product Expiration Date: 201908202359
Blood Product Expiration Date: 201908312359
ISSUE DATE / TIME: 201908111800
ISSUE DATE / TIME: 201908111800
UNIT TYPE AND RH: 6200
UNIT TYPE AND RH: 6200

## 2018-03-09 LAB — PREPARE FRESH FROZEN PLASMA
UNIT DIVISION: 0
Unit division: 0

## 2018-03-09 LAB — BPAM RBC
Blood Product Expiration Date: 201909072359
Blood Product Expiration Date: 201909072359
ISSUE DATE / TIME: 201908111800
ISSUE DATE / TIME: 201908111800
UNIT TYPE AND RH: 9500
Unit Type and Rh: 9500

## 2018-03-09 LAB — LACTIC ACID, PLASMA: LACTIC ACID, VENOUS: 1.9 mmol/L (ref 0.5–1.9)

## 2018-03-09 LAB — HIV ANTIBODY (ROUTINE TESTING W REFLEX): HIV Screen 4th Generation wRfx: NONREACTIVE

## 2018-03-09 LAB — BLOOD PRODUCT ORDER (VERBAL) VERIFICATION

## 2018-03-09 LAB — ABO/RH: ABO/RH(D): O POS

## 2018-03-09 MED ORDER — DOCUSATE SODIUM 100 MG PO CAPS
100.0000 mg | ORAL_CAPSULE | Freq: Two times a day (BID) | ORAL | Status: DC
  Administered 2018-03-09 – 2018-03-12 (×7): 100 mg via ORAL
  Filled 2018-03-09 (×7): qty 1

## 2018-03-09 MED ORDER — FENTANYL CITRATE (PF) 100 MCG/2ML IJ SOLN
50.0000 ug | INTRAMUSCULAR | Status: DC | PRN
  Administered 2018-03-09: 50 ug via INTRAVENOUS
  Filled 2018-03-09: qty 2

## 2018-03-09 MED ORDER — MELOXICAM 15 MG PO TABS: 15.0000 mg | ORAL_TABLET | Freq: Every day | ORAL | 0 refills | Status: DC

## 2018-03-09 MED ORDER — ONDANSETRON 4 MG PO TBDP
4.0000 mg | ORAL_TABLET | Freq: Four times a day (QID) | ORAL | Status: DC | PRN
  Filled 2018-03-09: qty 1

## 2018-03-09 MED ORDER — CELECOXIB 200 MG PO CAPS
200.0000 mg | ORAL_CAPSULE | Freq: Every day | ORAL | Status: DC
  Administered 2018-03-09 – 2018-03-11 (×3): 200 mg via ORAL
  Filled 2018-03-09 (×3): qty 1

## 2018-03-09 MED ORDER — SENNA-DOCUSATE SODIUM 8.6-50 MG PO TABS: 2.0000 | ORAL_TABLET | Freq: Every day | ORAL | 1 refills | Status: DC

## 2018-03-09 MED ORDER — ENOXAPARIN SODIUM 40 MG/0.4ML ~~LOC~~ SOLN
40.0000 mg | SUBCUTANEOUS | Status: DC
  Administered 2018-03-09 – 2018-03-12 (×4): 40 mg via SUBCUTANEOUS
  Filled 2018-03-09 (×4): qty 0.4

## 2018-03-09 MED ORDER — METHOCARBAMOL 500 MG PO TABS
500.0000 mg | ORAL_TABLET | Freq: Three times a day (TID) | ORAL | Status: DC
Start: 2018-03-09 — End: 2018-03-11
  Administered 2018-03-09 – 2018-03-11 (×7): 500 mg via ORAL
  Filled 2018-03-09 (×7): qty 1

## 2018-03-09 MED ORDER — ACETAMINOPHEN 325 MG PO TABS
650.0000 mg | ORAL_TABLET | Freq: Three times a day (TID) | ORAL | Status: DC
  Administered 2018-03-09: 650 mg via ORAL
  Filled 2018-03-09: qty 2

## 2018-03-09 MED ORDER — MORPHINE SULFATE (PF) 2 MG/ML IV SOLN: 2.0000 mg | INTRAVENOUS | Status: DC | PRN

## 2018-03-09 MED ORDER — OXYCODONE HCL 5 MG PO TABS: 5.0000 mg | ORAL_TABLET | ORAL | Status: DC | PRN

## 2018-03-09 MED ORDER — ACETAMINOPHEN 500 MG PO TABS
1000.0000 mg | ORAL_TABLET | Freq: Three times a day (TID) | ORAL | Status: DC
  Administered 2018-03-09 – 2018-03-12 (×8): 1000 mg via ORAL
  Filled 2018-03-09 (×8): qty 2

## 2018-03-09 MED ORDER — FENTANYL CITRATE (PF) 100 MCG/2ML IJ SOLN
50.0000 ug | INTRAMUSCULAR | Status: DC | PRN
  Administered 2018-03-09 (×2): 100 ug via INTRAVENOUS
  Administered 2018-03-09 – 2018-03-11 (×7): 50 ug via INTRAVENOUS
  Filled 2018-03-09 (×9): qty 2

## 2018-03-09 MED ORDER — CEPHALEXIN 500 MG PO CAPS: 500.0000 mg | ORAL_CAPSULE | Freq: Four times a day (QID) | ORAL | 0 refills | Status: DC

## 2018-03-09 MED ORDER — CEFAZOLIN SODIUM-DEXTROSE 1-4 GM/50ML-% IV SOLN
1.0000 g | Freq: Three times a day (TID) | INTRAVENOUS | Status: AC
  Administered 2018-03-09 (×2): 1 g via INTRAVENOUS
  Filled 2018-03-09 (×2): qty 50

## 2018-03-09 MED ORDER — ONDANSETRON HCL 4 MG/2ML IJ SOLN
INTRAMUSCULAR | Status: AC
  Filled 2018-03-09: qty 2

## 2018-03-09 MED ORDER — HYDROMORPHONE HCL 1 MG/ML IJ SOLN
INTRAMUSCULAR | Status: AC
  Administered 2018-03-09: 0.5 mg via INTRAVENOUS
  Filled 2018-03-09: qty 1

## 2018-03-09 MED ORDER — BACITRACIN ZINC 500 UNIT/GM EX OINT
TOPICAL_OINTMENT | Freq: Two times a day (BID) | CUTANEOUS | Status: DC
Start: 2018-03-09 — End: 2018-03-12
  Administered 2018-03-09: 31.5556 via TOPICAL
  Administered 2018-03-09: 1 via TOPICAL
  Administered 2018-03-10: 31.5556 via TOPICAL
  Administered 2018-03-10: 1 via TOPICAL
  Administered 2018-03-11 (×2): 31.5556 via TOPICAL
  Administered 2018-03-12: 1 via TOPICAL
  Filled 2018-03-09: qty 28.4

## 2018-03-09 MED ORDER — ONDANSETRON HCL 4 MG/2ML IJ SOLN
4.0000 mg | Freq: Four times a day (QID) | INTRAMUSCULAR | Status: DC | PRN
  Administered 2018-03-09: 4 mg via INTRAVENOUS
  Filled 2018-03-09: qty 2

## 2018-03-09 MED ORDER — OXYCODONE HCL 5 MG PO TABS: 10.0000 mg | ORAL_TABLET | ORAL | Status: DC | PRN

## 2018-03-09 MED ORDER — CEFAZOLIN SODIUM-DEXTROSE 2-4 GM/100ML-% IV SOLN
2.0000 g | Freq: Three times a day (TID) | INTRAVENOUS | Status: AC
  Administered 2018-03-09 – 2018-03-12 (×9): 2 g via INTRAVENOUS
  Filled 2018-03-09 (×9): qty 100

## 2018-03-09 NOTE — Anesthesia Postprocedure Evaluation (Signed)
Anesthesia Post Note  Patient: Scott Romero  Procedure(s) Performed: IRRIGATION AND DEBRIDEMENT EXTREMITY (Left Arm Lower) OPEN REDUCTION INTERNAL FIXATION (ORIF) DISTAL RADIAL  and ulner FRACTURE     Patient location during evaluation: PACU Anesthesia Type: General Level of consciousness: awake and alert Pain management: pain level controlled Vital Signs Assessment: post-procedure vital signs reviewed and stable Respiratory status: spontaneous breathing, nonlabored ventilation, respiratory function stable and patient connected to nasal cannula oxygen Cardiovascular status: blood pressure returned to baseline and stable Postop Assessment: no apparent nausea or vomiting Anesthetic complications: no    Last Vitals:  Vitals:   03/08/18 1915 03/08/18 2350  BP: 129/76 128/83  Pulse: 71 (!) 103  Resp: 16 19  Temp:  (!) 36.3 C  SpO2: 94% 97%    Last Pain:  Vitals:   03/08/18 2350  TempSrc:   PainSc: 0-No pain                 Dwane Andres DAVID

## 2018-03-09 NOTE — Progress Notes (Addendum)
[facility] Progress Note  1 Day Post-Op  Subjective: CC-  Patient reports pain in left wrist. Fentanyl helps. Cannot tolerate oxcodone, hydrocodone, morphine, dilaudid due to hives and GI upset; unsure about tramadol. States that he has noticed some right wrist pain and ecchymosis since admission. No other new complaints. Denies abdominal pain, n/v. Tolerating clear liquids.  Works as a Education officer, communitydentist. 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 Nonsmoker. Drinks 2 beers daily.  Objective: Vital signs in last 24 hours: Temp:  [96.8 F (36 C)-99 F (37.2 C)] 98.3 F (36.8 C) (08/12 0508) Pulse Rate:  [67-103] 78 (08/12 0508) Resp:  [10-23] 16 (08/12 0508) BP: (124-142)/(69-92) 138/77 (08/12 0508) SpO2:  [91 %-100 %] 100 % (08/12 0508) Weight:  [76.2 kg] 76.2 kg (08/11 1827)    Intake/Output from previous day: 08/11 0701 - 08/12 0700 In: 2015 [I.V.:2008.3; IV Piggyback:6.7] Out: 1050 [Urine:850; Blood:200] Intake/Output this shift: No intake/output data recorded.  PE: Gen:  Alert, NAD, pleasant HEENT: EOM's intact, pupils equal and round. Lac to forehead cdi without erythema or drainage Card:  RRR, 2+ DP pulses Pulm:  CTAB, no W/R/R, effort normal Abd: Soft, NT/ND, +BS, no HSM, no hernia BLE:  Calves soft and nontender.  LUE: Posterior splint and sling, fingers WWP, able to wiggle fingers RUE: ecchymosis noted to volar aspect of wrist with mild edema. TTP ulnar aspect. ROM intact but painful. No pain in forearm or elbow Psych: A&Ox3  Skin: no rashes noted, warm and dry  Lab Results:  Recent Labs    03/08/18 1816 03/08/18 1828 03/09/18 0334  WBC 9.7  --  16.3*  HGB 13.3 13.3 11.4*  HCT 40.6 39.0 35.2*  PLT 317  --  253   BMET Recent Labs    03/08/18 1816 03/08/18 1828 03/09/18 0334  NA 141 142 141  K 3.8 3.9 4.4  CL 107 108 109  CO2 23  --  25  GLUCOSE 98 97 149*  BUN 12 14 11   CREATININE 1.08 1.40* 1.09  CALCIUM 9.0  --  7.9*   PT/INR No results for  input(s): LABPROT, INR in the last 72 hours. CMP     Component Value Date/Time   NA 141 03/09/2018 0334   K 4.4 03/09/2018 0334   CL 109 03/09/2018 0334   CO2 25 03/09/2018 0334   GLUCOSE 149 (H) 03/09/2018 0334   BUN 11 03/09/2018 0334   CREATININE 1.09 03/09/2018 0334   CALCIUM 7.9 (L) 03/09/2018 0334   PROT 6.6 03/08/2018 1816   ALBUMIN 4.1 03/08/2018 1816   AST 36 03/08/2018 1816   ALT 32 03/08/2018 1816   ALKPHOS 49 03/08/2018 1816   BILITOT 0.6 03/08/2018 1816   GFRNONAA >60 03/09/2018 0334   GFRAA >60 03/09/2018 0334   Lipase  No results found for: LIPASE     Studies/Results: Dg Forearm Left  Result Date: 03/09/2018 CLINICAL DATA:  Status post ORIF of radial and ulnar fractures. EXAM: LEFT FOREARM - 1 VIEW COMPARISON:  Previous films from the same day. FINDINGS: Fixation sideplate is now noted along the radius and ulna with anatomic alignment of the fracture fragments. Some splinting material is noted posteriorly. No other focal abnormality is noted. IMPRESSION: Status post ORIF of radial and ulnar fractures on the left. Electronically Signed   By: Alcide CleverMark  Lukens M.D.   On: 03/09/2018 00:28   Dg Forearm Left  Result Date: 03/08/2018 CLINICAL DATA:  Pain after trauma EXAM: LEFT FOREARM - 2 VIEW COMPARISON:  None.  FINDINGS: Fractures are identified in the proximal diaphyses of the radius and ulna. No other fractures noted. There is gauze over the arm. Multiple tiny foci of high attenuation in the soft tissues could be within the gauze, on the skin, or represent small foreign bodies. Recommend clinical correlation. IMPRESSION: 1. Fractures of the proximal radius and ulna with significant displacement. 2. Multiple foci of high attenuation over the soft tissues could be within the overlying dressings, on the skin, or small foreign bodies. Recommend clinical correlation. Electronically Signed   By: Gerome Samavid  Williams III M.D   On: 03/08/2018 18:41   Ct Head Wo Contrast  Result Date:  03/08/2018 CLINICAL DATA:  Lawnmower accident. EXAM: CT HEAD WITHOUT CONTRAST CT CERVICAL SPINE WITHOUT CONTRAST TECHNIQUE: Multidetector CT imaging of the head and cervical spine was performed following the standard protocol without intravenous contrast. Multiplanar CT image reconstructions of the cervical spine were also generated. COMPARISON:  None. FINDINGS: CT HEAD FINDINGS Brain: No evidence of acute infarction, hemorrhage, hydrocephalus, extra-axial collection or mass lesion/mass effect. 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 Vascular: No hyperdense vessel or unexpected calcification. Skull: Normal. Negative for fracture or focal lesion. Sinuses/Orbits: No acute finding. Other: Small left frontal scout laceration. CT CERVICAL SPINE FINDINGS Alignment: Normal. Skull base and vertebrae: No acute fracture. No primary bone lesion or focal pathologic process. Soft tissues and spinal canal: No prevertebral fluid or swelling. No visible canal hematoma. Disc levels:  Unremarkable Upper chest: Negative Other: 9 IMPRESSION: 1. No acute intracranial abnormality. 2. No acute abnormality of the cervical spine. 3. Left frontal scalp laceration Electronically Signed   By: Signa Kellaylor  Stroud M.D.   On: 03/08/2018 19:26   Ct Cervical Spine Wo Contrast  Result Date: 03/08/2018 CLINICAL DATA:  Lawnmower accident. EXAM: CT HEAD WITHOUT CONTRAST CT CERVICAL SPINE WITHOUT CONTRAST TECHNIQUE: Multidetector CT imaging of the head and cervical spine was performed following the standard protocol without intravenous contrast. Multiplanar CT image reconstructions of the cervical spine were also generated. COMPARISON:  None. FINDINGS: CT HEAD FINDINGS Brain: No evidence of acute infarction, hemorrhage, hydrocephalus, extra-axial collection or mass lesion/mass effect. 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 Vascular: No hyperdense vessel or unexpected  calcification. Skull: Normal. Negative for fracture or focal lesion. Sinuses/Orbits: No acute finding. Other: Small left frontal scout laceration. CT CERVICAL SPINE FINDINGS Alignment: Normal. Skull base and vertebrae: No acute fracture. No primary bone lesion or focal pathologic process. Soft tissues and spinal canal: No prevertebral fluid or swelling. No visible canal hematoma. Disc levels:  Unremarkable Upper chest: Negative Other: 9 IMPRESSION: 1. No acute intracranial abnormality. 2. No acute abnormality of the cervical spine. 3. Left frontal scalp laceration Electronically Signed   By: Signa Kellaylor  Stroud M.D.   On: 03/08/2018 19:26   Ct Angio Up Extrem Left W &/or Wo Contast  Result Date: 03/08/2018 CLINICAL DATA:  Recent lawnmower accident with significant laceration of the left arm with ulnar and radial fractures EXAM: CT ANGIOGRAPHY OF THE LEFT UPPEREXTREMITY TECHNIQUE: Multidetector CT imaging of the left upperwas performed using the standard protocol during bolus administration of intravenous contrast. Multiplanar CT image reconstructions and MIPs were obtained to evaluate the vascular anatomy. CONTRAST:  100mL ISOVUE-370 IOPAMIDOL (ISOVUE-370) INJECTION 76% COMPARISON:  None. FINDINGS: Vascular: The thoracic aorta and its branches are well visualized and widely patent. No significant atherosclerotic changes are noted. The pulmonary artery as visualized is within normal limits. The left subclavian artery and  axillary artery as well as the brachial artery are well visualized and within normal limits. The brachial trifurcation is patent. The radial artery is well visualized and extends through the forearm into the plantar arch without abnormality. No significant arterial narrowing or findings of dissection are seen. No active extravasation is noted. The ulnar artery is also well visualized and demonstrates a normal appearance with some tortuosity in the distal forearm although no a occlusion or significant  spasm is seen. No active extravasation is noted. No rapidly expanding hematoma is seen. The interosseous artery is diminutive likely related to significant soft tissue edema and spasm. The vasculature of the chest and abdomen and pelvis are partially visualized due to the positioning of the arm adjacent to the body. Abdominal aorta and its branches are within normal limits. No focal abnormality is seen. Nonvascular: Visualized lungs are well aerated without evidence of focal infiltrate or sizable effusion. No pneumothorax is noted. The osseous structures demonstrate the previously described proximal shaft fractures of the radius and ulna. No other bony abnormality is seen. The visualize ribcage is unremarkable. The esophagus is within normal limits. No hilar or mediastinal adenopathy is noted. The thoracic inlet is unremarkable. Limited visualization of the abdominal visceral organs shows no focal abnormality. Review of the MIP images confirms the above findings. IMPRESSION: The vasculature of the left upper extremity is within normal limits with the exception of some spasm in the interosseous artery consistent with the recent radial and ulnar fractures and local soft tissue edema. No focal abnormality of the radial or ulnar artery is seen. No evidence of hematoma or active extravasation. The remainder of the visualized structures to include parts of the chest, abdomen and pelvis reveal no focal abnormality. Electronically Signed   By: Alcide Clever M.D.   On: 03/08/2018 19:38   Dg Chest Portable 1 View  Result Date: 03/08/2018 CLINICAL DATA:  Trauma.  Pain. EXAM: PORTABLE CHEST 1 VIEW COMPARISON:  None. FINDINGS: The heart size and mediastinal contours are within normal limits. Both lungs are clear. The visualized skeletal structures are unremarkable. IMPRESSION: No active disease. Electronically Signed   By: Gerome Sam III M.D   On: 03/08/2018 18:42    Anti-infectives: Anti-infectives (From admission,  onward)   Start     Dose/Rate Route Frequency Ordered Stop   03/09/18 0400  ceFAZolin (ANCEF) IVPB 1 g/50 mL premix     1 g 100 mL/hr over 30 Minutes Intravenous Every 8 hours 03/09/18 0118 03/09/18 1959   03/08/18 1830  ceFAZolin (ANCEF) IVPB 1 g/50 mL premix     1 g 100 mL/hr over 30 Minutes Intravenous  Once 03/08/18 1819 03/08/18 1859   03/08/18 1820  ceFAZolin (ANCEF) 2-4 GM/100ML-% IVPB    Note to Pharmacy:  [facility]   : cabinet override      03/08/18 1820 03/09/18 0629       Assessment/Plan Lawnmower accident Forehead laceration - s/p repair 8/11 in ED, bacitracin BID L open BB forearm fx - s/p ORIF and complex wound closure  8/11 Dr. Dion Saucier. NWB LUE in posterior splint ABL anemia - Hg 11.4 from 13.3, VSS, monitor Lactic acidosis - LA 2.51, recheck R wrist pain - xray  ID - ancef 8/11>>8/12 FEN - IVF, reg diet VTE - SCDs, lovenox Foley - none Follow up - [facility]  Plan - PT/OT. Repeat lactic acid. Consult social work for [employer]. Xray for right wrist pain. Add robaxin and increase to tylenol 1000mg  for better pain control.  LOS: 1 day    Franne Forts , [facility] 03/09/2018, 9:03 AM Pager: [phone] Consults: [phone] Mon 7:00 am -11:30 AM Tues-Fri 7:00 am-4:30 pm Sat-Sun 7:00 am-11:30 am

## 2018-03-09 NOTE — Progress Notes (Signed)
20:10 Patient called me to his room complaining of swelling to his left arm. Elevated left arm and assess patient for capillary refill. Vital signs stable. Placed ice on left arm. Paged PA. Call back received from PA to loosing up ace wrap dressing.  Will continue to monitor

## 2018-03-09 NOTE — Progress Notes (Addendum)
Patient ID: Scott Romero, male   DOB: August 22, 1970, 47 y.o.   MRN: [id number]     Subjective:  Patient reports pain as mild to moderate.  Patient in bed and in no acute distress.  Denies any CP or SOB  Objective:   VITALS:   Vitals:   03/09/18 0045 03/09/18 0100 03/09/18 0120 03/09/18 0508  BP: 130/76 130/77 131/69 138/77  Pulse: 90 89 70 78  Resp: (!) 23 15 16 16   Temp:   98.3 F (36.8 C) 98.3 F (36.8 C)  TempSrc:   Oral Oral  SpO2: 97% 98% 100% 100%  Weight:      Height:        ABD soft Sensation intact distally Dorsiflexion/Plantar flexion intact Incision: dressing C/D/I and no drainage Long arm splint in place  Lab Results  Component Value Date   WBC 16.3 (H) 03/09/2018   HGB 11.4 (L) 03/09/2018   HCT 35.2 (L) 03/09/2018   MCV 94.4 03/09/2018   PLT 253 03/09/2018   BMET    Component Value Date/Time   NA 141 03/09/2018 0334   K 4.4 03/09/2018 0334   CL 109 03/09/2018 0334   CO2 25 03/09/2018 0334   GLUCOSE 149 (H) 03/09/2018 0334   BUN 11 03/09/2018 0334   CREATININE 1.09 03/09/2018 0334   CALCIUM 7.9 (L) 03/09/2018 0334   GFRNONAA >60 03/09/2018 0334   GFRAA >60 03/09/2018 0334     Assessment/Plan: 1 Day Post-Op   Active Problems:   Open forearm fracture, left, sequela   Advance diet Up with therapy Continue plan per trauma NWB left upper ext Sling for comfort     DOUGLAS PARRY, BRANDON 03/09/2018, 2:00 PM  Discussed and agree with above.  Plan for discharge probably tomorrow with po abx.  C/w iv abx while in house.  Teryl LucyJoshua Jemma Rasp, MD Cell [phone]

## 2018-03-09 NOTE — Evaluation (Signed)
Physical Therapy Evaluation Patient Details Name: Scott CrateBenjamin Romero MRN: [id number] DOB: [birth year] Today's Date: 03/09/2018   History of Present Illness  Scott Romero is 47 y.o. M s/p ORIF of L Radius & Ulna Fracture of LUE following a lawnmower accident. No pertinent PMH.    Clinical Impression  Pt presents with problems above and deficits below. Pt was educated on NWB precautions of LUE. Pt performed bed mobility with MinA. Pt performed sit<>stand transfers and ambulated with Supervision. Pt reported increased pain with mobility, but tolerated activity well, and expressed desires for increased activity. Will continue to follow acutely to support independence, mobility, and safety.     Follow Up Recommendations No PT follow up    Equipment Recommendations  None recommended by PT    Recommendations for Other Services OT consult     Precautions / Restrictions Precautions Precautions: Fall Required Braces or Orthoses: Sling Restrictions Weight Bearing Restrictions: Yes LUE Weight Bearing: Non weight bearing      Mobility  Bed Mobility Overal bed mobility: Needs Assistance Bed Mobility: Supine to Sit     Supine to sit: Supervision;Min assist     General bed mobility comments: Pt required MinA for trunk elevation, but was able to scoot hips to EOB without physical assist.   Transfers Overall transfer level: Needs assistance Equipment used: None Transfers: Sit to/from Stand Sit to Stand: Supervision         General transfer comment: Pt was able to perform sit<>stands with Supervision while following NWB precautions. Pt reported no dizziness with transfer.   Ambulation/Gait Ambulation/Gait assistance: Supervision Gait Distance (Feet): 200 Feet Assistive device: None Gait Pattern/deviations: Step-through pattern;Wide base of support;Decreased stride length Gait velocity: Decreased   General Gait Details: Pt took short steps with a wide BOS and toes turned out. Pt  was guarding L shoulder during gait, and had a waddle-type gait. Pt reported no unsteadiness during ambulation, but did note increased pain with activity.   Stairs            Wheelchair Mobility    Modified Rankin (Stroke Patients Only)       Balance Overall balance assessment: Needs assistance Sitting-balance support: No upper extremity supported;Feet supported Sitting balance-Leahy Scale: Good Sitting balance - Comments: Pt was able to scoot hips to EOB   Standing balance support: No upper extremity supported Standing balance-Leahy Scale: Good Standing balance comment: Pt was able to toilet and perform some ADLs while standing without UE support. Pt had no visible LOB during functional activities during static standing.                              Pertinent Vitals/Pain Pain Assessment: Faces Faces Pain Scale: Hurts whole lot Pain Location: L UE Pain Descriptors / Indicators: Grimacing;Guarding Pain Intervention(s): Limited activity within patient's tolerance;Monitored during session;Repositioned    Home Living Family/patient expects to be discharged to:: Private residence Living Arrangements: Spouse/significant other;Children Available Help at Discharge: Family;Available PRN/intermittently(Wife works. Father could come visit occasionally ) Type of Home: House Home Access: Stairs to enter Entrance Stairs-Rails: Right;Left;Can reach both Entrance Stairs-Number of Steps: 3 Home Layout: Two level;Able to live on main level with bedroom/bathroom Home Equipment: Shower seat - built in Additional Comments: Pt has three children.     Prior Function Level of Independence: Independent         Comments: Pt is a Education officer, communitydentist, was active, participated in crossfit, and lawncare.  Hand Dominance   Dominant Hand: Left    Extremity/Trunk Assessment   Upper Extremity Assessment Upper Extremity Assessment: Defer to OT evaluation;LUE deficits/detail LUE  Deficits / Details: Pt reports sensation in fingertips is WNL, and pain at incision with clenching fingers. Pt reports history of L rotator cuff problems.  LUE Sensation: WNL    Lower Extremity Assessment Lower Extremity Assessment: Overall WFL for tasks assessed    Cervical / Trunk Assessment Cervical / Trunk Assessment: Normal  Communication   Communication: No difficulties  Cognition Arousal/Alertness: Awake/alert Behavior During Therapy: WFL for tasks assessed/performed Overall Cognitive Status: Within Functional Limits for tasks assessed                                 General Comments: Pt is alert and pleasant.       General Comments      Exercises     Assessment/Plan    PT Assessment Patient needs continued PT services  PT Problem List Decreased strength;Decreased range of motion;Decreased mobility;Decreased knowledge of precautions;Pain       PT Treatment Interventions Gait training;Stair training;Functional mobility training;Therapeutic activities;Therapeutic exercise;Patient/family education    PT Goals (Current goals can be found in the Care Plan section)  Acute Rehab PT Goals Patient Stated Goal: To get back to everything; swimming, cooking, gardening.  PT Goal Formulation: With patient Time For Goal Achievement: 03/23/18 Potential to Achieve Goals: Good    Frequency Min 5X/week   Barriers to discharge        Co-evaluation               AM-PAC PT "6 Clicks" Daily Activity  Outcome Measure Difficulty turning over in bed (including adjusting bedclothes, sheets and blankets)?: Unable Difficulty moving from lying on back to sitting on the side of the bed? : Unable Difficulty sitting down on and standing up from a chair with arms (e.g., wheelchair, bedside commode, etc,.)?: A Little Help needed moving to and from a bed to chair (including a wheelchair)?: A Little Help needed walking in hospital room?: A Little Help needed climbing  3-5 steps with a railing? : A Little 6 Click Score: 14    End of Session Equipment Utilized During Treatment: Gait belt;Other (comment)(Sling) Activity Tolerance: Patient tolerated treatment well Patient left: in chair;with call bell/phone within reach Nurse Communication: Mobility status PT Visit Diagnosis: Other abnormalities of gait and mobility (R26.89);Pain Pain - Right/Left: Left Pain - part of body: Arm    Time: [id number] PT Time Calculation (min) (ACUTE ONLY): 27 min   Charges:   PT Evaluation $PT Eval Low Complexity: 1 Low PT Treatments $Gait Training: 8-22 mins   Demetria PoreJulia Dreyah Montrose, S-DPT Acute Care Rehab Student [phone]     03/09/2018, 2:04 PM

## 2018-03-10 LAB — CBC
HCT: 31.5 % — ABNORMAL LOW (ref 39.0–52.0)
Hemoglobin: 10.2 g/dL — ABNORMAL LOW (ref 13.0–17.0)
MCH: 31.1 pg (ref 26.0–34.0)
MCHC: 32.4 g/dL (ref 30.0–36.0)
MCV: 96 fL (ref 78.0–100.0)
PLATELETS: 195 10*3/uL (ref 150–400)
RBC: 3.28 MIL/uL — AB (ref 4.22–5.81)
RDW: 12.2 % (ref 11.5–15.5)
WBC: 10.1 10*3/uL (ref 4.0–10.5)

## 2018-03-10 LAB — BASIC METABOLIC PANEL
Anion gap: 6 (ref 5–15)
BUN: 5 mg/dL — AB (ref 6–20)
CO2: 25 mmol/L (ref 22–32)
CREATININE: 0.97 mg/dL (ref 0.61–1.24)
Calcium: 8.1 mg/dL — ABNORMAL LOW (ref 8.9–10.3)
Chloride: 111 mmol/L (ref 98–111)
GFR calc Af Amer: >60 mL/min (ref >60)
GLUCOSE: 103 mg/dL — AB (ref 70–99)
POTASSIUM: 4 mmol/L (ref 3.5–5.1)
Sodium: 142 mmol/L (ref 135–145)

## 2018-03-10 NOTE — Progress Notes (Signed)
Physical Therapy Treatment Patient Details Name: Scott Romero MRN: [id number] DOB: 22-Jan-1971 Today's Date: 03/10/2018    History of Present Illness Scott Romero is 47 y.o. M s/p ORIF of L Radius & Ulna Fracture of LUE following a lawnmower accident. No pertinent PMH.      PT Comments    Pt making steady progress towards goals, performing stairs with min guard for safety. Pt reported increased pain and swelling in L UE at beginning of treatment and continued to perseverate on potential for compartment syndrome through out session. Pt's symptoms monitored throughout and did not limit him with stair negotiation or ambulation. Addressed pt's concerns with ADL's at d/c following OT's recommendations. Repositioned pt at end of session, elevating arm with multiple pillows. Pt declined ice on LUE, reporting the ice bag wasn't penetrating through splint. Patient will benefit from skilled PT to increase their independence and safety with mobility to allow discharge to home.          Follow Up Recommendations  No PT follow up     Equipment Recommendations  None recommended by PT    Recommendations for Other Services       Precautions / Restrictions Precautions Precautions: Fall Required Braces or Orthoses: Sling Restrictions Weight Bearing Restrictions: Yes LUE Weight Bearing: Non weight bearing    Mobility  Bed Mobility Overal bed mobility: Needs Assistance Bed Mobility: Rolling;Sidelying to Sit;Sit to Sidelying Rolling: Supervision Sidelying to sit: Min assist     Sit to sidelying: Min guard General bed mobility comments: Min A with trunk elevation secondary to pain; min cues for hand placement to assist with powering up   Transfers Overall transfer level: Needs assistance Equipment used: None Transfers: Sit to/from Stand Sit to Stand: Supervision         General transfer comment: Supervision for safety.   Ambulation/Gait Ambulation/Gait assistance:  Supervision Gait Distance (Feet): 250 Feet Assistive device: IV Pole Gait Pattern/deviations: Step-through pattern;Wide base of support Gait velocity: Decreased Gait velocity interpretation: 1.31 - 2.62 ft/sec, indicative of limited community ambulator General Gait Details: Pt demonstrated guarding on L UE secondary to pain and swelling. Increased time and effort for gait noted.    Stairs Stairs: Yes Stairs assistance: Min guard Stair Management: One rail Right;Backwards;Forwards;Step to pattern;Alternating pattern Number of Stairs: 3 General stair comments: VCs for safety and technique. Pt ascended stairs forwards and descended backwards in order to utilize rail with RUE.    Wheelchair Mobility    Modified Rankin (Stroke Patients Only)       Balance Overall balance assessment: Needs assistance Sitting-balance support: No upper extremity supported;Feet supported Sitting balance-Leahy Scale: Good     Standing balance support: Single extremity supported Standing balance-Leahy Scale: Good Standing balance comment: Utilized IV pole to improve dynamic stability. Statically able to use BR without single UE support and no LOB.                              Cognition Arousal/Alertness: Awake/alert Behavior During Therapy: WFL for tasks assessed/performed Overall Cognitive Status: Within Functional Limits for tasks assessed                                           General Comments General comments (skin integrity, edema, etc.): Pt verbalizing concern for compartment syndrome. He was able to verbalize signs and symptoms.  Pt does have pain with passive movement of digits and swelling that increases in upright position and diminishes when in supine with L UE elevated.       Pertinent Vitals/Pain Pain Assessment: 0-10 Pain Score: 8  Faces Pain Scale: Hurts even more Pain Location: L UE; ribs Pain Descriptors / Indicators:  Grimacing;Guarding;Discomfort Pain Intervention(s): Limited activity within patient's tolerance;Monitored during session;Repositioned    Home Living Family/patient expects to be discharged to:: Private residence Living Arrangements: Spouse/significant other;Children(17 y.o. and 8 y.o.) Available Help at Discharge: Family;Available PRN/intermittently(Wife works; father could visit occasionally) Type of Home: House Home Access: Stairs to enter Entrance Stairs-Rails: Right;Left;Can reach both Home Layout: Two level;Able to live on main level with bedroom/bathroom Home Equipment: Shower seat - built in      Prior Function Level of Independence: Independent      Comments: Pt is a Education officer, communitydentist, was active, participated in crossfit, and lawncare.    PT Goals (current goals can now be found in the care plan section) Acute Rehab PT Goals Patient Stated Goal: To get back to everything; swimming, cooking, gardening.  PT Goal Formulation: With patient Time For Goal Achievement: 03/23/18 Potential to Achieve Goals: Good Progress towards PT goals: Progressing toward goals    Frequency    Min 5X/week      PT Plan Current plan remains appropriate    Co-evaluation              AM-PAC PT "6 Clicks" Daily Activity  Outcome Measure  Difficulty turning over in bed (including adjusting bedclothes, sheets and blankets)?: A Lot Difficulty moving from lying on back to sitting on the side of the bed? : Unable Difficulty sitting down on and standing up from a chair with arms (e.g., wheelchair, bedside commode, etc,.)?: A Little Help needed moving to and from a bed to chair (including a wheelchair)?: A Little Help needed walking in hospital room?: A Little Help needed climbing 3-5 steps with a railing? : A Little 6 Click Score: 15    End of Session Equipment Utilized During Treatment: Other (comment)(sling) Activity Tolerance: Patient tolerated treatment well Patient left: in bed;with call  bell/phone within reach Nurse Communication: Mobility status PT Visit Diagnosis: Other abnormalities of gait and mobility (R26.89);Pain Pain - Right/Left: Left Pain - part of body: Arm     Time: [id number] PT Time Calculation (min) (ACUTE ONLY): 28 min  Charges:     2 gait training 23-37 min                   Donzetta KohutKaylee Eusebia Grulke, [place]  Student Physical Therapist Acute Rehab [phone]    Donzetta KohutKaylee Aedyn Kempfer 03/10/2018, 1:29 PM

## 2018-03-10 NOTE — Progress Notes (Signed)
Patient ID: Scott Romero, male   DOB: 03-31-1971, 47 y.o.   MRN: [id number]   Patient POD#2 left BBFF ORIF with complicated wound closure from open fracture Dr. Dion SaucierLandau and Dr. Everardo PacificVarkey.  Received phone call from RN 2000 last night pt c/o "tightness/swelling in forearm under cast with increased pain"  Instructed to unwrap splint and some of the cast padding to relieve pressure.  Received second call at 0445 this morning same complaints.  Came in to assess pt capillary refill intact, slight discomfort with passive extension of fingers, ROM fingers intact with moderate swelling in fingers/hand.  Removed ace and essentially bivalved cast padding, scant drainage on dressing, compartments soft without pallor, pt almost immediately began to feel some relief, no longer with pain with passive motion fingers and hand, intact distal pulses.  Small skin tear or cut over biceps dressing with adaptic and gauze and rewrapped splint with ace and back in sling.  Will communicate with ortho team on status and may need to resplint prior to d/c home.

## 2018-03-10 NOTE — Care Management Note (Signed)
Case Management Note  Patient Details  Name: Sharman CrateBenjamin Sublette MRN: [id number] Date of Birth: 1971-02-17  Subjective/Objective:  Sharman CrateBenjamin Miklas is 47 y.o. M s/p ORIF of L Radius & Ulna Fracture of LUE following a lawnmower accident.  PTA, pt independent, lives with wife.                  Action/Plan: PT/OT recommending no OP follow up.  Pt/family currently requesting a recliner that they may rent, as pt does not have one.  Unfortunately, this will not be covered by insurance as they had hoped.  I currently have no contacts through medical supply for rental geri-chairs or recliners, but pt's family member was able to find one through [employer] for $25/week, and they plan to use this.  Will continue to follow progress.  Expected Discharge Date:                  Expected Discharge Plan:     In-House Referral:     Discharge planning Services     Post Acute Care Choice:    Choice offered to:     DME Arranged:    DME Agency:     HH Arranged:    HH Agency:     Status of Service:     If discussed at [employer] Meetings, dates discussed:    Additional Comments:  Quintella BatonJulie W. Ariyon Gerstenberger, RN, BSN  Trauma/Neuro ICU Case Manager [phone]

## 2018-03-10 NOTE — Plan of Care (Signed)

## 2018-03-10 NOTE — Progress Notes (Signed)
Patient seen and evaluated, he had increasing pain overnight.  Since having had the splint bivalved, his pain is significantly relieved, he is not sure if it is the fentanyl.  On exam he does not really have any pain with passive motion of the fingers, actively he can extend and flex all of his fingers, although he cannot make a full fist, but he does have at least 45 degrees of motion in each digit.  He has a bounding radial pulse.  I palpated his compartments as well, which do not feel overly tense.  He has a loose approximation with a splint currently.  Impression status post both bone forearm fracture ORIF with significant soft tissue injury and postoperative pain  Plan I have educated him on compartment syndrome signs, and he is going to continue to monitor himself as well.  I do not currently see compartment syndrome, I suspect that the massive amount of trauma and swelling has ultimately set in.  He also is complaining of increased pain around his chest wall and is thinking that he actually had more injury that initially appreciated.  I am not sure if he is going to be capable of going home today from a pain control standpoint, is still needing IV pain medications, I do not see evidence for compartment syndrome at this point.  We will continue to monitor.  Eulas PostJoshua P Kristian Mogg, MD

## 2018-03-10 NOTE — Evaluation (Signed)
Occupational Therapy Evaluation Patient Details Name: Scott Romero MRN: [id number] DOB: [birth year] Today's Date: 03/10/2018    History of Present Illness Scott Romero is 47 y.o. M s/p ORIF of L Radius & Ulna Fracture of LUE following a lawnmower accident. No pertinent PMH.     Clinical Impression   PTA, pt was independent with ADL and functional mobility and working full time as a Education officer, communitydentist. He maintains an active lifestyle. He is currently significantly limited by L UE and rib pain impacting [facility] ability [facility]. He is L handed making functional tasks significantly difficult. Initiated education concerning compensatory ADL strategies to maximize participation and safety during self-care. Noted increase in edema in L hand when seated at EOB that improved when supine with L UE elevated. Pt would benefit from continued OT services while admitted to improve independence with ADL and functional mobility prior to returning home. Do not anticipate need for immediate OT follow-up but once cleared by surgeon, pt may benefit from outpatient OT to maximize functional use of L UE for work skills.     Follow Up Recommendations  Outpatient OT;Supervision/Assistance - 24 hour(outpatient OT once cleared by surgeon for hand therapy)    Equipment Recommendations  None recommended by OT    Recommendations for Other Services       Precautions / Restrictions Precautions Precautions: Fall Required Braces or Orthoses: Sling Restrictions Weight Bearing Restrictions: Yes LUE Weight Bearing: Non weight bearing      Mobility Bed Mobility Overal bed mobility: Needs Assistance Bed Mobility: Rolling;Sidelying to Sit Rolling: Supervision Sidelying to sit: Supervision       General bed mobility comments: Much increased time and effort but able to power [facility] with overall supervision for safety.   Transfers Overall transfer level: Needs assistance Equipment used:  None Transfers: Sit to/from Stand Sit to Stand: Supervision         General transfer comment: Supervision for safety.     Balance Overall balance assessment: Needs assistance Sitting-balance support: No upper extremity supported;Feet supported Sitting balance-Leahy Scale: Good     Standing balance support: No upper extremity supported Standing balance-Leahy Scale: Good                             ADL either performed or assessed with clinical judgement   ADL Overall ADL's : Needs assistance/impaired Eating/Feeding: Sitting;Minimal assistance   Grooming: Minimal assistance;Standing Grooming Details (indicate cue type and reason): assist for bimanual tasks Upper Body Bathing: Maximal assistance;Sitting Upper Body Bathing Details (indicate cue type and reason): simulated  Lower Body Bathing: Moderate assistance;Sit to/from stand   Upper Body Dressing : Maximal assistance;Sitting   Lower Body Dressing: Moderate assistance;Sit to/from stand   Toilet Transfer: Supervision/safety;Ambulation   Toileting- Clothing Manipulation and Hygiene: Supervision/safety;Sit to/from stand       Functional mobility during ADLs: Supervision/safety General ADL Comments: Pt requiring significant assistance for dressing and bathing tasks due to limited L UE use. Initiated education concerning compensatory strategies but pt will need further education.      Vision Patient Visual Report: No change from baseline Vision Assessment?: No apparent visual deficits     Perception     Praxis      Pertinent Vitals/Pain Pain Assessment: Faces Faces Pain Scale: Hurts even more Pain Location: L UE; ribs Pain Descriptors / Indicators: Grimacing;Guarding Pain Intervention(s): Limited activity within patient's tolerance;Monitored during session;Repositioned     Hand Dominance  Left   Extremity/Trunk Assessment Upper Extremity Assessment Upper Extremity Assessment: LUE  deficits/detail;RUE deficits/detail RUE Deficits / Details: Reports R wrist pain but functional ROM and strength for ADL.  LUE Deficits / Details: Sensation in tact. Able to complete minimal digit flexion but able to fully extend actively. Pt with history of L rotator cuff injury. PROM limited to 75% composite fist. Pain with passive extension.  LUE Sensation: WNL   Lower Extremity Assessment Lower Extremity Assessment: Overall WFL for tasks assessed   Cervical / Trunk Assessment Cervical / Trunk Assessment: Normal   Communication Communication Communication: No difficulties   Cognition Arousal/Alertness: Awake/alert Behavior During Therapy: WFL for tasks assessed/performed Overall Cognitive Status: Within Functional Limits for tasks assessed                                     General Comments  Pt verbalizing concern for compartment syndrome. He was able to verbalize signs and symptoms. Pt does have pain with passive movement of digits and swelling that increases in upright position and diminishes when in supine with L UE elevated.     Exercises Exercises: Other exercises Other Exercises Other Exercises: Educated pt concerning cervical AROM to limit tension in neck.  Other Exercises: Educated pt concerning shoulder shrugs x10 prior to meals.  Other Exercises: Educated pt concerning composite fist and extension x10 prior to meals.  Other Exercises: Educated pt concerning elevation, use of ice, and desensitizatrion techniques including massage.    Shoulder Instructions      Home Living Family/patient expects to be discharged to:: Private residence Living Arrangements: Spouse/significant other;Children(17 y.o. and 8 y.o.) Available Help at Discharge: Family;Available PRN/intermittently(Wife works; father could visit occasionally) Type of Home: House Home Access: Stairs to enter [employer] CorporationEntrance Stairs-Number of Steps: 3 Entrance Stairs-Rails: Right;Left;Can reach  both Home Layout: Two level;Able to live on main level with bedroom/bathroom Alternate Level Stairs-Number of Steps: Flight   Bathroom Shower/Tub: Producer, television/film/videoWalk-in shower   Bathroom Toilet: Standard Bathroom Accessibility: Yes   Home Equipment: Information systems managerhower seat - built in          Prior Functioning/Environment Level of Independence: Independent        Comments: Pt is a Education officer, communitydentist, was active, participated in crossfit, and Youth workerlawncare.         OT Problem List: Decreased strength;Decreased range of motion;Decreased activity tolerance;Impaired balance (sitting and/or standing);Decreased safety awareness;Decreased knowledge of use of DME or AE;Decreased knowledge of precautions;Pain      OT Treatment/Interventions: Self-care/ADL training;Therapeutic exercise;Energy conservation;DME and/or AE instruction;Therapeutic activities;Patient/family education;Balance training    OT Goals(Current goals can be found in the care plan section) Acute Rehab OT Goals Patient Stated Goal: To get back to everything; swimming, cooking, gardening.  OT Goal Formulation: With patient Time For Goal Achievement: 03/24/18 Potential to Achieve Goals: Good ADL Goals Pt Will Perform Grooming: with modified independence;standing Pt Will Perform Upper Body Bathing: with min assist;sitting Pt Will Perform Upper Body Dressing: with min assist Pt Will Perform Lower Body Dressing: with modified independence;sit to/from stand Pt Will Transfer to Toilet: with modified independence;ambulating;bedside commode Pt Will Perform Toileting - Clothing Manipulation and hygiene: with modified independence;sit to/from stand Pt Will Perform Tub/Shower Transfer: with modified independence;Shower transfer;ambulating;shower seat  OT Frequency: Min 2X/week   Barriers to D/C:            Co-evaluation  AM-PAC PT "6 Clicks" Daily Activity     Outcome Measure Help from another person eating meals?: A Little Help from another  person taking care of personal grooming?: A Little Help from another person toileting, which includes using toliet, bedpan, or urinal?: A Little Help from another person bathing (including washing, rinsing, drying)?: A Lot Help from another person to put on and taking off regular upper body clothing?: A Lot Help from another person to put on and taking off regular lower body clothing?: A Lot 6 Click Score: 15   End of Session Equipment Utilized During Treatment: Other (comment)(L shoulder sling) Nurse Communication: Mobility status;Other (comment)(pain with PROM, swelling with upright)  Activity Tolerance: Patient tolerated treatment well Patient left: in bed;with call bell/phone within reach  OT Visit Diagnosis: Other abnormalities of gait and mobility (R26.89);Pain Pain - Right/Left: Left Pain - part of body: Arm                Time: 1610-9604 OT Time Calculation (min): 27 min Charges:  OT General Charges $OT Visit: 1 Visit OT Evaluation $OT Eval Moderate Complexity: 1 Mod OT Treatments $Self Care/Home Management : 8-22 mins  Doristine Section, MS OTR/L  Pager: [phone]   Tama Grosz A Zamiya Dillard 03/10/2018, 12:04 PM
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
## 2018-03-11 LAB — CBC
HCT: 30.6 % — ABNORMAL LOW (ref 39.0–52.0)
Hemoglobin: 10 g/dL — ABNORMAL LOW (ref 13.0–17.0)
MCH: 31.2 pg (ref 26.0–34.0)
MCHC: 32.7 g/dL (ref 30.0–36.0)
MCV: 95.3 fL (ref 78.0–100.0)
PLATELETS: 212 10*3/uL (ref 150–400)
RBC: 3.21 MIL/uL — ABNORMAL LOW (ref 4.22–5.81)
RDW: 12 % (ref 11.5–15.5)
WBC: 8.9 10*3/uL (ref 4.0–10.5)

## 2018-03-11 MED ORDER — HYDROMORPHONE HCL 1 MG/ML IJ SOLN: 0.5000 mg | INTRAMUSCULAR | Status: DC | PRN

## 2018-03-11 MED ORDER — TRAMADOL HCL 50 MG PO TABS: 50.0000 mg | ORAL_TABLET | Freq: Four times a day (QID) | ORAL | Status: DC | PRN

## 2018-03-11 MED ORDER — POLYETHYLENE GLYCOL 3350 17 G PO PACK
17.0000 g | PACK | Freq: Every day | ORAL | Status: DC
  Administered 2018-03-11 – 2018-03-12 (×2): 17 g via ORAL
  Filled 2018-03-11 (×2): qty 1

## 2018-03-11 MED ORDER — METHOCARBAMOL 500 MG PO TABS
1000.0000 mg | ORAL_TABLET | Freq: Three times a day (TID) | ORAL | Status: DC
  Administered 2018-03-11 – 2018-03-12 (×3): 1000 mg via ORAL
  Filled 2018-03-11 (×3): qty 2

## 2018-03-11 MED ORDER — KETOROLAC TROMETHAMINE 15 MG/ML IJ SOLN
15.0000 mg | Freq: Four times a day (QID) | INTRAMUSCULAR | Status: DC
  Administered 2018-03-11 – 2018-03-12 (×5): 15 mg via INTRAVENOUS
  Filled 2018-03-11 (×5): qty 1

## 2018-03-11 MED ORDER — TAB-A-VITE/IRON PO TABS
1.0000 | ORAL_TABLET | Freq: Every day | ORAL | Status: DC
  Administered 2018-03-11 – 2018-03-12 (×2): 1 via ORAL
  Filled 2018-03-11 (×2): qty 1

## 2018-03-11 MED ORDER — FENTANYL CITRATE (PF) 100 MCG/2ML IJ SOLN: 50.0000 ug | INTRAMUSCULAR | Status: DC | PRN

## 2018-03-11 NOTE — Plan of Care (Signed)
  Problem: Activity: Goal: Risk for activity intolerance will decrease Outcome: Progressing   Problem: Elimination: Goal: Will not experience complications related to bowel motility Outcome: Progressing   
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
## 2018-03-11 NOTE — Progress Notes (Signed)
Physical Therapy Treatment Patient Details Name: Scott Romero MRN: [id number] DOB: [birth year] Today's Date: 03/11/2018    History of Present Illness Scott Romero is 47 y.o. M s/p ORIF of L Radius & Ulna Fracture of LUE following a lawnmower accident. No pertinent PMH.      PT Comments    Pt continues to make steady progress towards goal, ambulating initially with min guard and progressing to supervision through out session. Pt ambulated without IV pole at time of session demonstrating a wider BOS initially which improved with min cues. Pt only expressed concerns with swelling and pain when asked at beginning of session, with no mention of compartment syndrome which pt perseverated on in the previous session. Monitored symptoms through out with no visible increase in edema noted, but pt requested to return to room after approximately 400' due to fear of further swelling with arm by side instead of elevated. Returned to chair and repositioned with two pillows under pt's left shoulder and arm. Will continue to monitor acutely in order to improve independence with mobility.    Follow Up Recommendations  No PT follow up     Equipment Recommendations  None recommended by PT    Recommendations for Other Services       Precautions / Restrictions Precautions Precautions: Fall Required Braces or Orthoses: Sling Restrictions Weight Bearing Restrictions: Yes LUE Weight Bearing: Non weight bearing    Mobility  Bed Mobility               General bed mobility comments: Pt standing by BR door upon entering  Transfers Overall transfer level: Needs assistance Equipment used: None Transfers: Sit to/from Stand Sit to Stand: Supervision         General transfer comment: VC for safety and hand placement when descending to chair as chair wasn't completely behind pt before he began to sit.  Increased time and effort second to pain  Ambulation/Gait Ambulation/Gait assistance: Min  Guard;Supervision Gait Distance (Feet): 400 Feet Assistive device: None Gait Pattern/deviations: Step-through pattern Gait velocity: Decreased Gait velocity interpretation: <1.31 ft/sec, indicative of household ambulator General Gait Details: Pt ambulated without use of single UE support, with min cues for decreasing wide base of support. Pt continues to demonstrating guarding on L UE. Pt supported L UE during gait with R UE reporting it alieved pressure from the strap around the neck, as he was suffering from cervical pain present at baseline.    Stairs         General stair comments: Did not practice stairs this session.   Wheelchair Mobility    Modified Rankin (Stroke Patients Only)       Balance Overall balance assessment: Needs assistance Sitting-balance support: No upper extremity supported;Feet supported Sitting balance-Leahy Scale: Good     Standing balance support: No upper extremity supported Standing balance-Leahy Scale: Good Standing balance comment: Pt initially demonstrated a wide base of support with dynamic balance activities, but improved with min cues. No LOB when correcting gait pattern.                             Cognition Arousal/Alertness: Awake/alert Behavior During Therapy: WFL for tasks assessed/performed Overall Cognitive Status: Within Functional Limits for tasks assessed  Exercises      General Comments General comments (skin integrity, edema, etc.): Pt did not mention any concerns of compartment syndrome at todays session.       Pertinent Vitals/Pain Pain Assessment: 0-10 Pain Score: 7  Faces Pain Scale: Hurts little more Pain Location: L UE; ribs Pain Descriptors / Indicators: Discomfort;Guarding;Grimacing Pain Intervention(s): Limited activity within patient's tolerance;Monitored during session;Repositioned    Home Living                      Prior  Function            PT Goals (current goals can now be found in the care plan section) Acute Rehab PT Goals Patient Stated Goal: To get back to everything; swimming, cooking, gardening.  PT Goal Formulation: With patient Time For Goal Achievement: 03/23/18 Potential to Achieve Goals: Good Progress towards PT goals: Progressing toward goals    Frequency    Min 5X/week      PT Plan Current plan remains appropriate    Co-evaluation              AM-PAC PT "6 Clicks" Daily Activity  Outcome Measure  Difficulty turning over in bed (including adjusting bedclothes, sheets and blankets)?: A Lot Difficulty moving from lying on back to sitting on the side of the bed? : A Lot Difficulty sitting down on and standing up from a chair with arms (e.g., wheelchair, bedside commode, etc,.)?: A Little Help needed moving to and from a bed to chair (including a wheelchair)?: A Little Help needed walking in hospital room?: A Little Help needed climbing 3-5 steps with a railing? : A Little 6 Click Score: 16    End of Session Equipment Utilized During Treatment: Other (comment)(sling) Activity Tolerance: Patient tolerated treatment well Patient left: in chair;with call bell/phone within reach Nurse Communication: Mobility status PT Visit Diagnosis: Other abnormalities of gait and mobility (R26.89);Pain     Time: [id number] PT Time Calculation (min) (ACUTE ONLY): 14 min  Charges:  $Gait Training: 8-22 mins                     Scott Romero, [place]  Student Physical Therapist Acute Rehab [phone]    Scott Romero 03/11/2018, 2:09 PM

## 2018-03-11 NOTE — Plan of Care (Signed)
  Problem: Clinical Measurements: Goal: Respiratory complications will improve Outcome: Progressing   Problem: Safety: Goal: Ability to remain free from injury will improve 03/11/2018 2327 by Charlotte SanesVandyke, Finas Delone, RN Outcome: Progressing 03/11/2018 2325 by Charlotte SanesVandyke, Stasha Naraine, RN Outcome: Progressing   Problem: Activity: Goal: Risk for activity intolerance will decrease 03/11/2018 2327 by Charlotte SanesVandyke, Jalissa Heinzelman, RN Outcome: Progressing 03/11/2018 2325 by Charlotte SanesVandyke, [place] Perra, RN Outcome: Progressing   Problem: Clinical Measurements: Goal: Respiratory complications will improve Outcome: Progressing   Problem: Clinical Measurements: Goal: Will remain free from infection 03/11/2018 2327 by Charlotte SanesVandyke, Magalie Almon, RN Outcome: Progressing 03/11/2018 2325 by Charlotte SanesVandyke, Treysen Sudbeck, RN Outcome: Progressing Goal: Respiratory complications will improve Outcome: Progressing   Problem: Activity: Goal: Risk for activity intolerance will decrease 03/11/2018 2327 by Charlotte SanesVandyke, Doria Fern, RN Outcome: Progressing 03/11/2018 2325 by Charlotte SanesVandyke, Dequan Kindred, RN Outcome: Progressing   Problem: Nutrition: Goal: Adequate nutrition will be maintained 03/11/2018 2327 by Charlotte SanesVandyke, Tayonna Bacha, RN Outcome: Progressing 03/11/2018 2325 by Charlotte SanesVandyke, Ceazia Harb, RN Outcome: Progressing   Problem: Elimination: Goal: Will not experience complications related to bowel motility Outcome: Progressing   Problem: Safety: Goal: Ability to remain free from injury will improve 03/11/2018 2327 by Charlotte SanesVandyke, Tymika Grilli, RN Outcome: Progressing 03/11/2018 2325 by Charlotte SanesVandyke, Kaydence Baba, RN Outcome: Progressing   Problem: Skin Integrity: Goal: Risk for impaired skin integrity will decrease 03/11/2018 2327 by Charlotte SanesVandyke, Musette Kisamore, RN Outcome: Progressing 03/11/2018 2325 by Charlotte SanesVandyke, Athen Riel, RN Outcome: Progressing

## 2018-03-11 NOTE — Progress Notes (Addendum)
Patient ID: Scott Romero, male   DOB: [birth year], 47 y.o.   MRN: [id number]     Subjective:  Patient reports pain as mild to moderate.  Patient in bed and seems to be struggling with pain in the sternum.    Objective:   VITALS:   Vitals:   03/10/18 1430 03/10/18 1945 03/10/18 2116 03/11/18 0327  BP: 116/77 (!) 140/114 (!) 133/95 124/80  Pulse: 73 71 77 72  Resp: 18 18  18   Temp: 98.5 F (36.9 C) 98.4 F (36.9 C)  98.3 F (36.8 C)  TempSrc: Oral Oral  Oral  SpO2: 100% 100%  99%  Weight:      Height:        ABD soft Sensation intact distally Dorsiflexion/Plantar flexion intact Incision: dressing C/D/I and no drainage Long arm splint in place.  Lab Results  Component Value Date   WBC 8.9 03/11/2018   HGB 10.0 (L) 03/11/2018   HCT 30.6 (L) 03/11/2018   MCV 95.3 03/11/2018   PLT 212 03/11/2018   BMET    Component Value Date/Time   NA 142 03/10/2018 0409   K 4.0 03/10/2018 0409   CL 111 03/10/2018 0409   CO2 25 03/10/2018 0409   GLUCOSE 103 (H) 03/10/2018 0409   BUN 5 (L) 03/10/2018 0409   CREATININE 0.97 03/10/2018 0409   CALCIUM 8.1 (L) 03/10/2018 0409   GFRNONAA >60 03/10/2018 0409   GFRAA >60 03/10/2018 0409     Assessment/Plan: 3 Days Post-Op   Active Problems:   Open forearm fracture, left, sequela   Advance diet Up with therapy Continue plan per trauma NWB left upper ext. Splint at all times Sling for comfort    DOUGLAS PARRY, BRANDON 03/11/2018, 8:09 AM  Discussed, seen and agree with above.  No evidence for compartment syndrome.  No tremors.  C/w mobilization.   Teryl LucyJoshua Wenceslaus Gist, MD Cell [phone]
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
## 2018-03-11 NOTE — Discharge Instructions (Addendum)
Nonweightbearing to left upper extremity Keep splint on and dry at all times. Sling for comfort Take antibiotics to completion Monitor swelling and call with concerns Follow up as planned with orthopedics

## 2018-03-11 NOTE — Plan of Care (Signed)

## 2018-03-12 MED ORDER — MELOXICAM 15 MG PO TABS: 15.0000 mg | ORAL_TABLET | Freq: Every day | ORAL | 0 refills | Status: DC

## 2018-03-12 MED ORDER — METHOCARBAMOL 500 MG PO TABS: 1000.0000 mg | ORAL_TABLET | Freq: Three times a day (TID) | ORAL | 0 refills | Status: DC | PRN

## 2018-03-12 MED ORDER — POLYETHYLENE GLYCOL 3350 17 G PO PACK: 17.0000 g | PACK | Freq: Every day | ORAL | 0 refills | Status: DC | PRN

## 2018-03-12 MED ORDER — ACETAMINOPHEN 500 MG PO TABS: 1000.0000 mg | ORAL_TABLET | Freq: Three times a day (TID) | ORAL | 0 refills | Status: DC | PRN

## 2018-03-12 MED ORDER — CEPHALEXIN 500 MG PO CAPS: 500.0000 mg | ORAL_CAPSULE | Freq: Four times a day (QID) | ORAL | 0 refills | Status: DC

## 2018-03-12 MED ORDER — TRAMADOL HCL 50 MG PO TABS: 50.0000 mg | ORAL_TABLET | Freq: Four times a day (QID) | ORAL | 0 refills | Status: DC | PRN

## 2018-03-12 MED ORDER — BACITRACIN ZINC 500 UNIT/GM EX OINT: TOPICAL_OINTMENT | Freq: Two times a day (BID) | CUTANEOUS | 0 refills | Status: DC
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
## 2018-03-12 NOTE — Progress Notes (Signed)
Orthopedic Tech Progress Note Patient Details:  Scott CrateBenjamin Romero Jul 17, 1971 [id number]  Ortho Devices Type of Ortho Device: Abduction pillow Ortho Device/Splint Interventions: Application   Post Interventions Patient Tolerated: Well Instructions Provided: Care of device   Saul FordyceJennifer C Eneida Evers 03/12/2018, 2:51 PM

## 2018-03-12 NOTE — Progress Notes (Addendum)
Patient ID: Scott Romero, male   DOB: 1971-01-29, 47 y.o.   MRN: [id number]     Subjective:  Patient reports pain as mild.  Patient up walking in the room and in no acute distress.  Objective:   VITALS:   Vitals:   03/11/18 0327 03/11/18 1100 03/11/18 1941 03/12/18 0628  BP: 124/80 122/89 (!) 142/76 110/74  Pulse: 72 69 82 65  Resp: 18 18 18 19   Temp: 98.3 F (36.8 C) 98.6 F (37 C) (!) 97.4 F (36.3 C) 98.6 F (37 C)  TempSrc: Oral Oral Oral Oral  SpO2: 99% 98% 100% 98%  Weight:      Height:        ABD soft Sensation intact distally Dorsiflexion/Plantar flexion intact Incision: dressing C/D/I and no drainage   Lab Results  Component Value Date   WBC 8.9 03/11/2018   HGB 10.0 (L) 03/11/2018   HCT 30.6 (L) 03/11/2018   MCV 95.3 03/11/2018   PLT 212 03/11/2018   BMET    Component Value Date/Time   NA 142 03/10/2018 0409   K 4.0 03/10/2018 0409   CL 111 03/10/2018 0409   CO2 25 03/10/2018 0409   GLUCOSE 103 (H) 03/10/2018 0409   BUN 5 (L) 03/10/2018 0409   CREATININE 0.97 03/10/2018 0409   CALCIUM 8.1 (L) 03/10/2018 0409   GFRNONAA >60 03/10/2018 0409   GFRAA >60 03/10/2018 0409     Assessment/Plan: 4 Days Post-Op   Active Problems:   Open forearm fracture, left, sequela   Advance diet Up with therapy  Okay for abduction sling Continue plan per trauma Patient waiting on DC home Follow up with Dr Dion SaucierLandau on Monday at 8:30    Scott Romero, Scott Romero 03/12/2018, 2:01 PM  Discussed and agree with above.   Scott LucyJoshua Mehmet Scally, MD Cell [phone]

## 2018-03-12 NOTE — Progress Notes (Signed)
Occupational Therapy Treatment Patient Details Name: Sharman CrateBenjamin Bonsell MRN: [id number] DOB: [birth year] Today's Date: 03/12/2018    History of present illness Sharman CrateBenjamin Falter is 47 y.o. M s/p ORIF of L Radius & Ulna Fracture of LUE following a lawnmower accident. No pertinent PMH.     OT comments  Pt seen to reinforce edema management techniques, L UE precautions and compensatory strategies for ADL. Pt able to verbalize and /or demonstrate knowledge of all information. Ready to d/c home.  Follow Up Recommendations  Outpatient OT;Supervision/Assistance - 24 hour;Follow surgeon's recommendation for DC plan and follow-up therapies    Equipment Recommendations  None recommended by OT    Recommendations for Other Services      Precautions / Restrictions Precautions Shoulder Interventions: Shoulder sling/immobilizer Required Braces or Orthoses: Sling Restrictions LUE Weight Bearing: Non weight bearing       Mobility Bed Mobility               General bed mobility comments: OOB in recliner on my arrival.   Transfers Overall transfer level: Modified independent Equipment used: None                  Balance     Sitting balance-Leahy Scale: Good       Standing balance-Leahy Scale: Good                             ADL either performed or assessed with clinical judgement   ADL       Grooming: Modified independent;Standing           Upper Body Dressing : Minimal assistance;Standing Upper Body Dressing Details (indicate cue type and reason): instructed in compensatory strategies, sling positioning Lower Body Dressing: Modified independent;Sit to/from stand Lower Body Dressing Details (indicate cue type and reason): educated to use elastic waist pants for ease with toileting Toilet Transfer: Ambulation;Modified Independent           Functional mobility during ADLs: Modified independent General ADL Comments: reinforced edema management  techniques     Vision       Perception     Praxis      Cognition Arousal/Alertness: Awake/alert Behavior During Therapy: WFL for tasks assessed/performed Overall Cognitive Status: Within Functional Limits for tasks assessed                                          Exercises     Shoulder Instructions       General Comments      Pertinent Vitals/ Pain       Pain Assessment: Faces Faces Pain Scale: Hurts a little bit Pain Location: L UE Pain Descriptors / Indicators: Discomfort;Guarding;Grimacing Pain Intervention(s): Monitored during session;Repositioned  Home Living                                          Prior Functioning/Environment              Frequency  Min 2X/week        Progress Toward Goals  OT Goals(current goals can now be found in the care plan section)  Progress towards OT goals: Progressing toward goals  Acute Rehab OT Goals Patient Stated Goal: To get back to everything; swimming, cooking, gardening.  OT Goal Formulation: With patient Time For Goal Achievement: 03/24/18 Potential to Achieve Goals: Good  Plan Discharge plan remains appropriate    Co-evaluation                 AM-PAC PT "6 Clicks" Daily Activity     Outcome Measure   Help from another person eating meals?: None Help from another person taking care of personal grooming?: None Help from another person toileting, which includes using toliet, bedpan, or urinal?: None Help from another person bathing (including washing, rinsing, drying)?: A Little Help from another person to put on and taking off regular upper body clothing?: A Little Help from another person to put on and taking off regular lower body clothing?: None 6 Click Score: 22    End of Session    OT Visit Diagnosis: Pain Pain - Right/Left: Left Pain - part of body: Arm   Activity Tolerance Patient tolerated treatment well   Patient Left in chair;with call  bell/phone within reach   Nurse Communication          Time: [id number] OT Time Calculation (min): 16 min  Charges: OT General Charges $OT Visit: 1 Visit OT Treatments $Self Care/Home Management : 8-22 mins  03/12/2018 Martie RoundJulie Melissa Tomaselli, OTR/L Pager: [phone]   Evern BioMayberry, Rhys Anchondo Lynn 03/12/2018, 11:20 AM

## 2018-03-12 NOTE — Plan of Care (Signed)
°  Problem: Education: °Goal: Knowledge of General Education information will improve °Description: Including pain rating scale, medication(s)/side effects and non-pharmacologic comfort measures °Outcome: Adequate for Discharge °  °Problem: Health Behavior/Discharge Planning: °Goal: Ability to manage health-related needs will improve °Outcome: Adequate for Discharge °  °Problem: Clinical Measurements: °Goal: Ability to maintain clinical measurements within normal limits will improve °Outcome: Adequate for Discharge °Goal: Will remain free from infection °Outcome: Adequate for Discharge °Goal: Diagnostic test results will improve °Outcome: Adequate for Discharge °Goal: Respiratory complications will improve °Outcome: Adequate for Discharge °Goal: Cardiovascular complication will be avoided °Outcome: Adequate for Discharge °  °Problem: Activity: °Goal: Risk for activity intolerance will decrease °Outcome: Adequate for Discharge °  °Problem: Nutrition: °Goal: Adequate nutrition will be maintained °Outcome: Adequate for Discharge °  °Problem: Elimination: °Goal: Will not experience complications related to bowel motility °Outcome: Adequate for Discharge °Goal: Will not experience complications related to urinary retention °Outcome: Adequate for Discharge °  °Problem: Safety: °Goal: Ability to remain free from injury will improve °Outcome: Adequate for Discharge °  °Problem: Skin Integrity: °Goal: Risk for impaired skin integrity will decrease °Outcome: Adequate for Discharge °  °

## 2018-03-13 ENCOUNTER — Encounter: Payer: Self-pay | Admitting: Family Medicine

## 2018-08-14 ENCOUNTER — Encounter: Payer: Self-pay | Admitting: Sports Medicine

## 2018-08-14 ENCOUNTER — Ambulatory Visit: Payer: BLUE CROSS/BLUE SHIELD | Admitting: Sports Medicine

## 2018-08-14 ENCOUNTER — Ambulatory Visit: Payer: Self-pay

## 2018-08-14 DIAGNOSIS — S5292XS Unspecified fracture of left forearm, sequela: Secondary | ICD-10-CM

## 2018-08-14 NOTE — Patient Instructions (Signed)
We are going reaching out to Dr. Dion Saucier to discuss possibly setting you up to perform a PRP (platelet rich plasma) injection. The cost of this is $475 out of pocket.  We will plan to have you come back at your convenience.  You will need to be off of anti-inflammatories for approximately 2 weeks prior to the injection and continue to avoid these medications (ibuprofen, Aleeve(naproxen), Meloxicam(mobic), Voltaren(diclofenac) during the initial 6 weeks following your injection. It is okay to take Tylenol if needed.  Please go ahead and fill your tramadol prescription so you have it available for after the procedure.

## 2018-08-14 NOTE — Progress Notes (Signed)
Scott FellsMichael D. Delorise Shinerigby, Scott  [place] [facility] [place] [phone]  Sharman CrateBenjamin Deupree - 48 y.o. male MRN [id number]  Date of birth: [birth year]  Visit Date: August 14, 2018  PCP: Eartha Romero, Scott Romero, Scott   Referred by: No ref. provider found  SUBJECTIVE:  Chief Complaint  Patient presents with  . Initial Assessment  . L forearm pain    Arm was run over by [employer] mower 02/2018. Seen in ED, had XR 03/08/2018 which showed fractures of the proximal radius and ulna with significant displacement.     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  Underwent ORIF at the time of injury and he been having decent healing has slowly started having results of the callus and concern for potential nonunion is high given transverse nature of the injury.  Dr. Dion SaucierLandau is a thick surgeon has discussed that if he lack improvement by next follow-up consideration of revision with bone grafting will need to be undertaken.  He is interested in PRP to see if this helps stimulate a improved healing response.  He has been using a bone stimulator as well as taking vitamin D supplements and K2 supplements as well as transaminases.  He is interested to try peptide protein injections and/or stem cells as well.  He is a family that this is left-hand-dominant and he has had a significant modify his work from some procedures also greatly advanced his activity as an exercise includes yesterday and unable to state things across it and not liking the previous day due to pain with any type of untoward brought to the forearm.  REVIEW OF SYSTEMS: Per HPI  HISTORY:  Prior history reviewed and updated per electronic medical record.  Social History   Occupational History  . Not on file  Tobacco Use  . Smoking  status: Never Smoker  . Smokeless tobacco: Never Used  Substance and Sexual Activity  . Alcohol use: Yes    Alcohol/week: 5.0 standard drinks    Types: 5 Cans of beer per week  . Drug use: Not Currently  . Sexual activity: Yes   Social History   Social History Narrative   ** Merged History Encounter **       History reviewed. No pertinent past medical history.  Past Surgical History:  Procedure Laterality Date  . I&D EXTREMITY Left 03/08/2018   Procedure: IRRIGATION AND DEBRIDEMENT EXTREMITY;  Surgeon: Teryl Romero, Joshua, Scott;  Location: [facility] OR;  Service: Orthopedics;  Laterality: Left;  . OPEN REDUCTION INTERNAL FIXATION (ORIF) DISTAL RADIAL FRACTURE  03/08/2018   Procedure: OPEN REDUCTION INTERNAL FIXATION (ORIF) DISTAL RADIAL  and ulner FRACTURE;  Surgeon: Teryl Romero, Joshua, Scott;  Location: [facility] OR;  Service: Orthopedics;;    family history is not on file.  DATA OBTAINED & REVIEWED:  Recent Labs    03/08/18 1816 03/09/18 0334 03/10/18 0409  CALCIUM 9.0 7.9* 8.1*  AST 36  --   --   ALT 32  --   --    No problems updated. No specialty comments available.  Initial x-rays available and came back system reviewed show significantly displaced both bone proximal third forearm fracture.  Angiogram was reassuring.  Patient does have outside film available for review on his telephone that did show some callus resorption at the area of  the fracture.  Hardware appears to be intact.  OBJECTIVE:  VS:  HT:6' (182.9 cm)   WT:176 lb 3.2 oz (79.9 kg)  BMI:23.89    BP:124/84  HR:65bpm  TEMP: ( )  RESP:97 %   PHYSICAL EXAM: CONSTITUTIONAL: Well-developed, Well-nourished and In no acute distress EYES: Pupils are equal., EOM intact without nystagmus. and No scleral icterus. Psychiatric: Alert & appropriately interactive. and Not depressed or anxious appearing. EXTREMITY EXAM: Warm and well perfused Pulses: Radial Pulses: Bilaterally normal and symmetric Ulnar Pulses: Bilaterally normal and  symmetric Skin with well-healed incisions Overall good elbow flexion and extension.  Markedly limited pronation.  He has no focal tenderness palpation over the fracture sites or scarring.  He does have some pain with fulcrum testing of the arm   ASSESSMENT  1. Open forearm fracture, left, sequela      PROCEDURES:  Diagnostic ultrasound  PLAN:  Pertinent additional documentation may be included in corresponding procedure notes, imaging studies, problem based documentation and patient instructions.  No problem-specific Assessment & Plan notes found for this encounter.  Ultimately long discussion today regarding the merits of intervening with PRP.  Ultimately is not on the ibuprofen with less Dr.Landau specifically was in agreement with this.  There are osseous cortical disruption in the patient that he accessed percutaneously away from the plate that PRP may help stimulate the growth.  He does understand this does not have evidence to entirely back of its use but at this time he is willing to take on the increased risk of infection given into her this point other therapy taken to avoid any direct proximity with the hardware.  He also understand this is expressed with significant undergo this.   Discussed that since thereis a revision surgery with bone grafting but he is frustrated by this because he is making progress continuously to slow and he would like to see.  My concern to be incomplete healing even with PRP and he is aware of this risk and I would like to the patient with Dr. Dion Saucier given the possibility of increased risk of infection with any procedure given the long hardware.  I would plan to prophylax him for several days before and after the procedure with antibiotics  Recommend to continue with the bone stimulator regardless as well as supplementation.  Activity modifications and the importance of avoiding exacerbating activities (limiting pain to no more than a 4 / 10 during or  following activity) recommended and discussed. Discussed red flag symptoms that warrant earlier emergent evaluation and patient voices understanding. >50% of this 45 minutes minute visit spent in direct patient counseling and/or coordination of care. Discussion was focused on education regarding the in discussing the pathoetiology and anticipated clinical course of the above condition. No orders of the defined types were placed in this encounter.  Lab Orders  No laboratory test(s) ordered today    Imaging Orders     [place] MSK POCT ULTRASOUND Referral Orders  No referral(s) requested today    Return for we will be in touch after talking to Dr. Dion Saucier.          [name], Scott    [place] [facility] Physician

## 2018-08-14 NOTE — Procedures (Signed)
LIMITED MSK ULTRASOUND OF Left forearm Images were obtained and interpreted by myself, Gaspar Bidding, DO  Images have been saved and stored to PACS system. Images obtained on: GE S7 Ultrasound machine  FINDINGS:   Proximal third of the humerus and ulna visualized with more noticeable cortical irregularity within the radius and ulna.  There is no increased neovascularity.  There is a small amount periosteal elevation directly around the area but minimal neovascularity.  The corticomedullary does appear to only be 1 to 2 mm wide.  Minimal.  Hardware appears to be intact ulnar aspect.  Less obvious cortical irregularity on the ulna than there is on the radius.  IMPRESSION:  1. 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

## 2018-09-01 ENCOUNTER — Other Ambulatory Visit: Payer: Self-pay

## 2018-09-01 MED ORDER — DOXYCYCLINE HYCLATE 100 MG PO CAPS: ORAL_CAPSULE | ORAL | 0 refills | Status: DC

## 2018-09-01 NOTE — Progress Notes (Signed)
Per Dr. Berline Chough - send in Doxycycline 100 mg to take BID x 5 days starting on 09/08/18 and ending 09/12/18. Will need to be in immobilizer x 2 weeks.

## 2018-09-01 NOTE — Progress Notes (Signed)
Rx sent to [facility] in [place].

## 2018-09-01 NOTE — Progress Notes (Signed)
Called pt and advised of abx and immobilization instructions.

## 2018-09-01 NOTE — Progress Notes (Signed)
Pt scheduled for 09/10/18 at 11:00 AM. He has been advised of PRP instructions. He said that Dr. Berline Chough was going to send in abx to take prior to procedure. He also wants to confirm if and for how long he may need to be immobilized.

## 2018-09-01 NOTE — Progress Notes (Signed)
We are going to be setting you up to perform a PRP (platelet rich plasma) injection. The cost of this is $495 out of pocket.  We will plan to have you come back at your convenience.  You will need to be off of anti-inflammatories for approximately 2 weeks prior to the injection and continue to avoid these medications (ibuprofen, Aleeve(naproxen), Meloxicam(mobic), Voltaren(diclofenac) during the initial 6 weeks following your injection. It is okay to take Tylenol if needed.  Please go ahead and fill your tramadol prescription so you have it available for after the procedure.  

## 2018-09-10 ENCOUNTER — Ambulatory Visit: Payer: Self-pay

## 2018-09-10 ENCOUNTER — Ambulatory Visit: Payer: Self-pay | Admitting: Sports Medicine

## 2018-09-10 ENCOUNTER — Encounter: Payer: Self-pay | Admitting: Sports Medicine

## 2018-09-10 VITALS — BP 130/80 | HR 66

## 2018-09-10 DIAGNOSIS — S5292XS Unspecified fracture of left forearm, sequela: Secondary | ICD-10-CM

## 2018-09-11 NOTE — Procedures (Signed)
  Scott Romero. Delorise Shiner Sports Medicine [facility] at [facility] [phone]  Scott Romero - 48 y.o. male MRN [id number]  Date of birth: 11-Jan-1971  Visit Date: 09/10/2018  PCP: Eartha Inch, MD   Referred by: Eartha Inch, MD  PRP Visit & Procedure Note  SUBJECTIVE:  Scott Romero is here for a procedure only visit for PRP (Platelet Rich Plasma).    Pt reports no significant changes in her general health or in her day to day symptoms for the condition(s) that are being treated today.   He has been taking 2 days of doxycycline preprocedurally.  Dr. Dion Saucier has referred him to Dr. Amanda Pea.  The patient does understand that this is considered experimental and understands the risk of treatment failure, infection and/or incomplete healing.  They deny any fevers, chills, or night sweats.   OBJECTIVE:  PHYSICAL EXAM: Please see previous exam notes for full joint and extremities No overlying skin changes appreciated.  MSK Exam: Left arm has postsurgical changes from prior crush injury and both bone forearm fracture.  ASSESSMENT   1. Open forearm fracture, left, sequela     PLAN:   Complicated case.  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  There is a appreciable gap within the radius away from the surgical plate and there is a lack of callus formation.  This was the targeted lesion.  Procedures:  PROCEDURE NOTE:  Ultrasound Guided: PRP Injection: Left forearm, radius nonunion  DESCRIPTION OF PROCEDURE:  The patient's clinical condition is marked by substantial pain and/or significant functional disability. Other conservative therapy has not provided relief, is contraindicated, or not appropriate. There is a reasonable likelihood that injection will significantly improve the patient's pain and/or functional impairment.   After discussing the risks, benefits and expected outcomes of the injection and all questions  were reviewed and answered, the patient wished to undergo the above named procedure.  Verbal consent was obtained. The skin was then prepped in sterile fashion and the target structure was injected as below:  PREP: Alcohol, Ethel Chloride and 5 cc 1% lidocaine on 25g 1.5 in. needle, sterile approach APPROACH: direct, single injection, 21g 2 in. INJECTATE: 3 cc PRP harvested with PEAK PRP System per manufactures recommendations. DRESSING: Band-Aid, 4 inch Ace wrap  Post procedural instructions including recommending icing and warning signs for infection were reviewed.    This procedure was well tolerated and there were no complications.  Return in about 2 weeks (around 09/24/2018).        [name], DO    [place] [facility] Physician

## 2018-09-24 ENCOUNTER — Ambulatory Visit: Payer: Self-pay

## 2018-09-24 ENCOUNTER — Encounter: Payer: Self-pay | Admitting: Sports Medicine

## 2018-09-24 ENCOUNTER — Ambulatory Visit (INDEPENDENT_AMBULATORY_CARE_PROVIDER_SITE_OTHER): Payer: BLUE CROSS/BLUE SHIELD | Admitting: Sports Medicine

## 2018-09-24 VITALS — BP 112/80 | HR 73 | Ht 72.0 in | Wt 176.0 lb

## 2018-09-24 DIAGNOSIS — S5292XS Unspecified fracture of left forearm, sequela: Secondary | ICD-10-CM

## 2018-09-27 ENCOUNTER — Encounter: Payer: Self-pay | Admitting: Sports Medicine

## 2018-09-27 NOTE — Progress Notes (Signed)
Scott Romero. Scott Romero Sports Medicine [facility] at [facility] [phone]  Scott Romero - 48 y.o. male MRN [id number]  Date of birth: 1971-05-04  Visit Date: 09/24/2018  PCP: Eartha Inch, MD   Referred by: Eartha Inch, MD  SUBJECTIVE:   Chief Complaint  Patient presents with  . Left Forearm - Follow-up    PRP 09/10/2018. Taking Vitamin D and K2. Has seen chiropractor and used bone stimulator.     HPI: Patient is here 2 weeks status post PRP.  Overall doing better.  He is continuing to use the bone stimulator.  He is working and having persistent pain but overall no significant improvements or worsening.  He denies any infectious symptoms.  He did have a fall yesterday where he did land on his left side however no significant pain.  REVIEW OF SYSTEMS: No significant nighttime awakenings due to this issue. Denies fevers, chills, recent weight gain or weight loss.  No night sweats.  Pt denies any change in bowel or bladder habits, muscle weakness, numbness or falls associated with this pain.  HISTORY:  Prior history reviewed and updated per electronic medical record.  Patient Active Problem List   Diagnosis Date Noted  . Open forearm fracture, left, sequela 03/08/2018    XR 03/08/2018 IMPRESSION: 1. Fractures of the proximal radius and ulna with significant displacement. 2. Multiple foci of high attenuation over the soft tissues could be within the overlying dressings, on the skin, or small foreign bodies. Recommend clinical correlation.  XR 03/09/2018 IMPRESSION: Status post ORIF of radial and ulnar fractures on the left.    Social History   Occupational History  . Not on file  Tobacco Use  . Smoking status: Never Smoker  . Smokeless tobacco: Never Used  Substance and Sexual Activity  . Alcohol use: Yes    Alcohol/week: 5.0 standard drinks    Types: 5 Cans of beer per week  . Drug use: Not Currently  . Sexual activity: Yes    Social History   Social History Narrative   ** Merged History Encounter **        OBJECTIVE:  VS:  HT:6' (182.9 cm)   WT:176 lb (79.8 kg)  BMI:23.86    BP:112/80  HR:73bpm  TEMP: ( )  RESP:97 %   PHYSICAL EXAM: Adult male. No acute distress.  Alert and appropriate. Left forearm has well-healed prior surgical incisions.  There is no surrounding erythema.  No area of fluctuance.  He has a negative fulcrum test.   ASSESSMENT:   1. Open forearm fracture, left, sequela     PROCEDURES:  None  PLAN:  Pertinent additional documentation may be included in corresponding procedure notes, imaging studies, problem based documentation and patient instructions.  No problem-specific Assessment & Plan notes found for this encounter.   Ultrasound does show some improved callus formation as well as larger periosteal elevation which is consistent with hopeful improvement in healing of the fracture.  There is bridging callus that was not previously seen and optimistic this will continue to improve.  I would like to see him back in 2 weeks after he has been seen by Dr. Orlan Leavens for second opinion.  Could consider repeat PRP injection at that time.  Cautious optimism discussed with the patient today.  Ultimately does know that surgical intervention may be warranted if any lack of improvement.  Continue previously prescribed home exercise program.   Activity modifications and the importance of avoiding exacerbating  activities (limiting pain to no more than a 4 / 10 during or following activity) recommended and discussed.  Discussed red flag symptoms that warrant earlier emergent evaluation and patient voices understanding.   No orders of the defined types were placed in this encounter.  Lab Orders  No laboratory test(s) ordered today   Imaging Orders     [place] MSK POCT ULTRASOUND Referral Orders  No referral(s) requested today    Return in about 2 weeks (around 10/08/2018).  Could  consider repeat PRP injection at that time per patient request and depending on recommendations from Dr. Orlan Leavens.          [name], DO    [place] [facility] Physician

## 2018-09-27 NOTE — Procedures (Signed)
LIMITED MSK ULTRASOUND OF Left forearm Images were obtained and interpreted by myself, Gaspar Bidding, DO  Images have been saved and stored to PACS system. Images obtained on: GE S7 Ultrasound machine  FINDINGS:   Radial proximal third fracture does show some improved callus formation.  There is a larger periosteal hematoma consistent with a possible evolving soft callus.  There is a bridging hard callus as previously incomplete does appear to be evolving.  Mild improvements and periosteal neovascularity.  IMPRESSION:  1. Slight interval healing of prior nonunion of the radius.

## 2018-10-08 ENCOUNTER — Other Ambulatory Visit: Payer: Self-pay

## 2018-10-08 ENCOUNTER — Ambulatory Visit (INDEPENDENT_AMBULATORY_CARE_PROVIDER_SITE_OTHER): Payer: BLUE CROSS/BLUE SHIELD | Admitting: Sports Medicine

## 2018-10-08 ENCOUNTER — Encounter: Payer: Self-pay | Admitting: Sports Medicine

## 2018-10-08 VITALS — BP 112/80 | HR 82 | Ht 72.0 in | Wt 175.6 lb

## 2018-10-08 DIAGNOSIS — S5292XS Unspecified fracture of left forearm, sequela: Secondary | ICD-10-CM | POA: Diagnosis not present

## 2018-10-11 NOTE — Procedures (Signed)
LIMITED MSK ULTRASOUND OF Left forearm Images were obtained and interpreted by myself, Gaspar Bidding, DO  Images have been saved and stored to PACS system. Images obtained on: GE S7 Ultrasound machine  FINDINGS:   Both transverse and longitudinal views of the ulna and radius were obtained.  There is still a persistent soft callus over the prior fracture site.  There is apparent improvement in the bridging callus of the distal radius but the site of the PRP is still obviously incomplete.  IMPRESSION:  1. 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

## 2018-10-11 NOTE — Progress Notes (Signed)
Scott Romero. Scott Romero Sports Medicine [facility] at [facility] [phone]  Scott Romero - 48 y.o. male MRN [id number]  Date of birth: 12/19/1970  Visit Date: 10/08/2018  PCP: Scott Inch, MD   Referred by: Scott Inch, MD  SUBJECTIVE:   Chief Complaint  Patient presents with  . Left Forearm - Follow-up    HPI: He is 4 weeks status post PRP injection into the radius fracture.  He has been seen by hand specialist at [facility] who is recommending continuing with conservative management.  At this time he is continuing to have pain on a day-to-day basis.  He is unable to perform lifting or twisting greater than 5 pounds.  He has noticed that opening the car door is no longer painful for him.  He is taking ibuprofen and Aleve intermittently at this point.  REVIEW OF SYSTEMS: No significant nighttime awakenings due to this issue. Denies fevers, chills, recent weight gain or weight loss.  No night sweats.  Pt denies any change in bowel or bladder habits, muscle weakness, numbness or falls associated with this pain.  HISTORY:  Prior history reviewed and updated per electronic medical record.  Patient Active Problem List   Diagnosis Date Noted  . Open forearm fracture, left, sequela 03/08/2018    XR 03/08/2018 IMPRESSION: 1. Fractures of the proximal radius and ulna with significant displacement. 2. Multiple foci of high attenuation over the soft tissues could be within the overlying dressings, on the skin, or small foreign bodies. Recommend clinical correlation.  XR 03/09/2018 IMPRESSION: Status post ORIF of radial and ulnar fractures on the left.    Social History   Occupational History  . Not on file  Tobacco Use  . Smoking status: Never Smoker  . Smokeless tobacco: Never Used  Substance and Sexual Activity  . Alcohol use: Yes    Alcohol/week: 5.0 standard drinks    Types: 5 Cans of beer per week  . Drug use: Not Currently  .  Sexual activity: Yes   Social History   Social History Narrative   ** Merged History Encounter **        OBJECTIVE:  VS:  HT:6' (182.9 cm)   WT:175 lb 9.6 oz (79.7 kg)  BMI:23.81    BP:112/80  HR:82bpm  TEMP: ( )  RESP:97 %   PHYSICAL EXAM: Adult male. No acute distress.  Alert and appropriate. Left forearm overall well aligned with postsurgical scarring.  He has minimal pain with direct palpation and mild pain with supination pronation.   ASSESSMENT:   1. Open forearm fracture, left, sequela     PROCEDURES:  None  PLAN:  Pertinent additional documentation may be included in corresponding procedure notes, imaging studies, problem based documentation and patient instructions.  No problem-specific Assessment & Plan notes found for this encounter.   Ultimately the MSK ultrasound does show some improvements in his soft callus as well as possible early bridging callus.  We did review the x-ray results that he had from [facility] as well as from Dr. Shelba Flake office and there is not appear to be any significant changes at this time compared to several months ago there has been some evolution of his ultrasound.  We did discuss the possibility of repeating PRP and this is something that can be considered.  He will let us know if he is interested.  Continue with his bone stimulator.      Activity modifications and the importance of  avoiding exacerbating activities (limiting pain to no more than a 4 / 10 during or following activity) recommended and discussed.   Discussed red flag symptoms that warrant earlier emergent evaluation and patient voices understanding.    No orders of the defined types were placed in this encounter.  Lab Orders  No laboratory test(s) ordered today   Imaging Orders     [place] MSK POCT ULTRASOUND Referral Orders  No referral(s) requested today     Return in about 2 weeks (around 10/22/2018).            Scott Mews, DO    [place]  Sports Medicine Physician

## 2018-10-12 ENCOUNTER — Ambulatory Visit: Payer: Self-pay

## 2019-03-11 ENCOUNTER — Other Ambulatory Visit: Payer: Self-pay | Admitting: Orthopedic Surgery

## 2019-03-11 DIAGNOSIS — T148XXD Other injury of unspecified body region, subsequent encounter: Secondary | ICD-10-CM

## 2019-03-23 ENCOUNTER — Ambulatory Visit
Admission: RE | Admit: 2019-03-23 | Discharge: 2019-03-23 | Disposition: A | Discharge status: Home / Self Care | Payer: BLUE CROSS/BLUE SHIELD | Source: Ambulatory Visit | Attending: Orthopedic Surgery | Admitting: Orthopedic Surgery

## 2019-03-23 ENCOUNTER — Other Ambulatory Visit: Payer: Self-pay

## 2019-03-23 DIAGNOSIS — T148XXD Other injury of unspecified body region, subsequent encounter: Secondary | ICD-10-CM

## 2019-08-03 IMAGING — DX DG CHEST 1V PORT
1 series · 1 of 1 positions shown · non-contrast
Comparison: None.

CLINICAL DATA: Trauma.  Pain.

EXAM:
PORTABLE CHEST 1 VIEW

[chest]
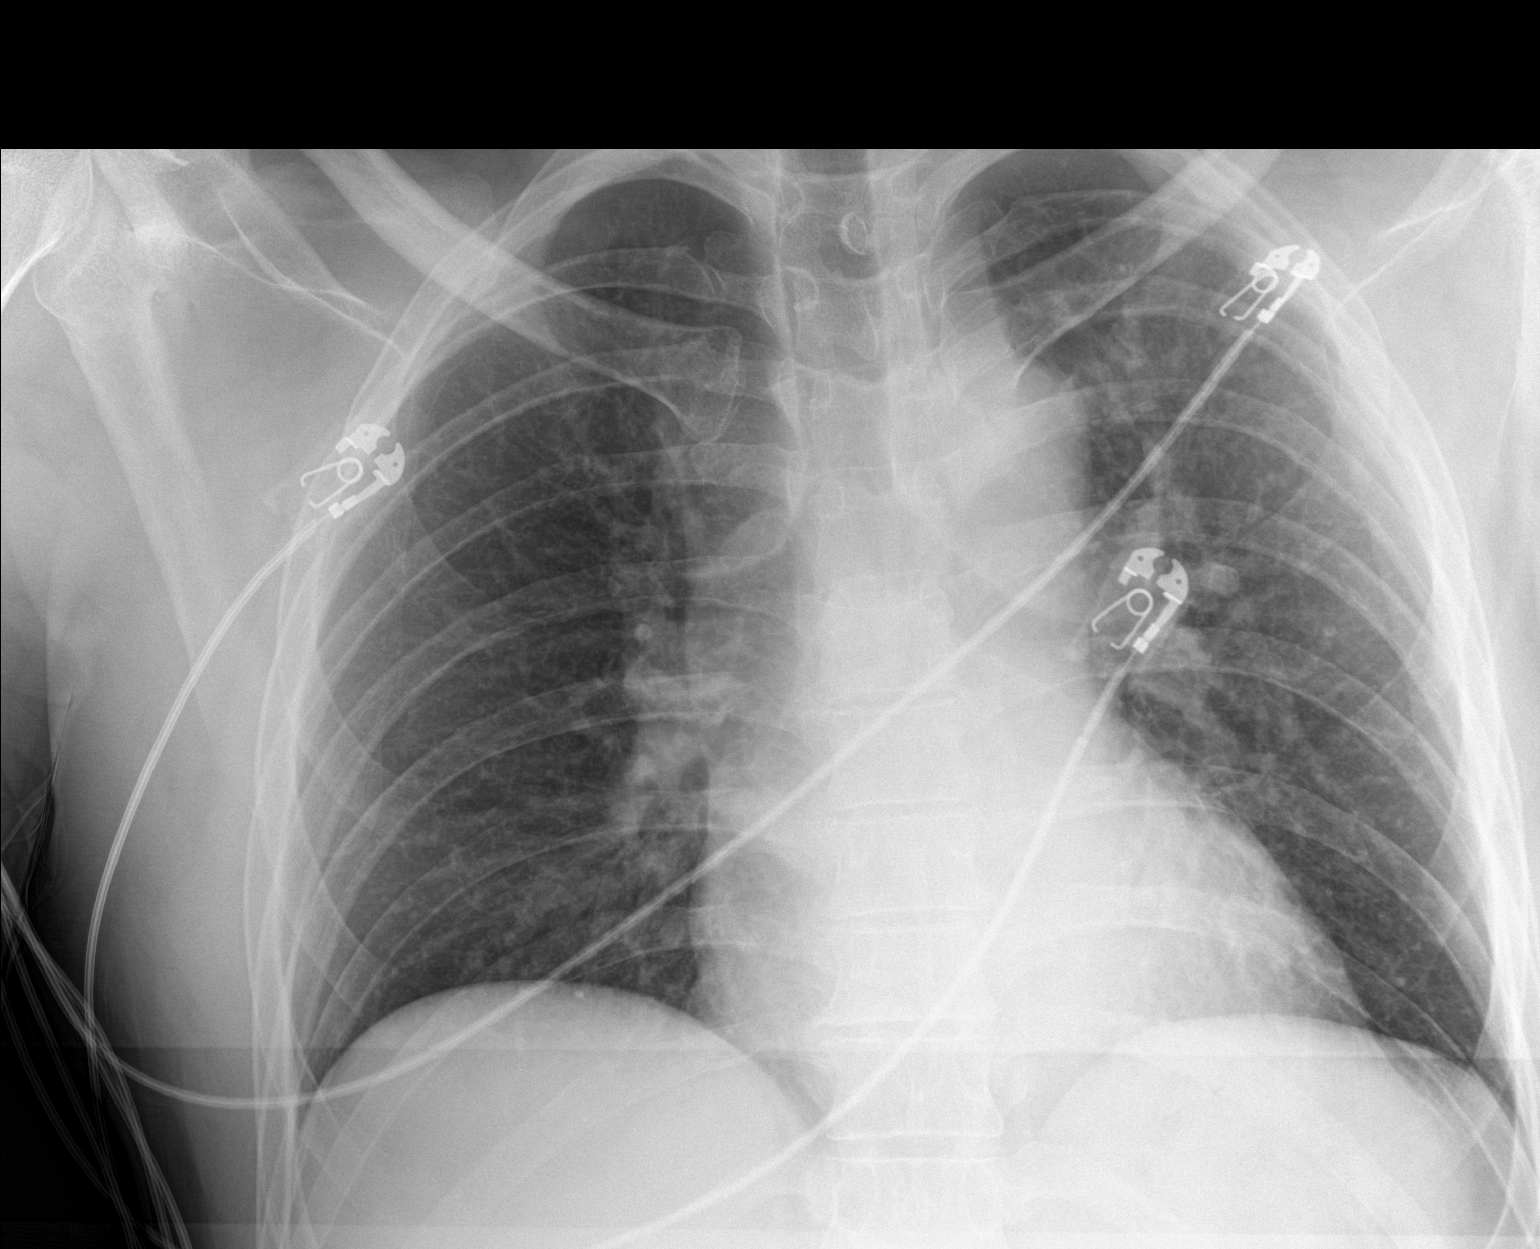

[1 of 1 positions shown; findings below may reference images not displayed]

FINDINGS: The heart size and mediastinal contours are within normal limits.
Both lungs are clear. The visualized skeletal structures are
unremarkable.
IMPRESSION: No active disease.

## 2019-08-04 IMAGING — DX DG FOREARM 2V*L*
1 series · 2 of 2 positions shown · non-contrast
Comparison: Previous films from the same day.

CLINICAL DATA: Status post ORIF of radial and ulnar fractures.

EXAM:
LEFT FOREARM - 1 VIEW

[Series 1: forearmbone · 0.14mm/px · 2 of 2 slices shown]
[im 1/2]
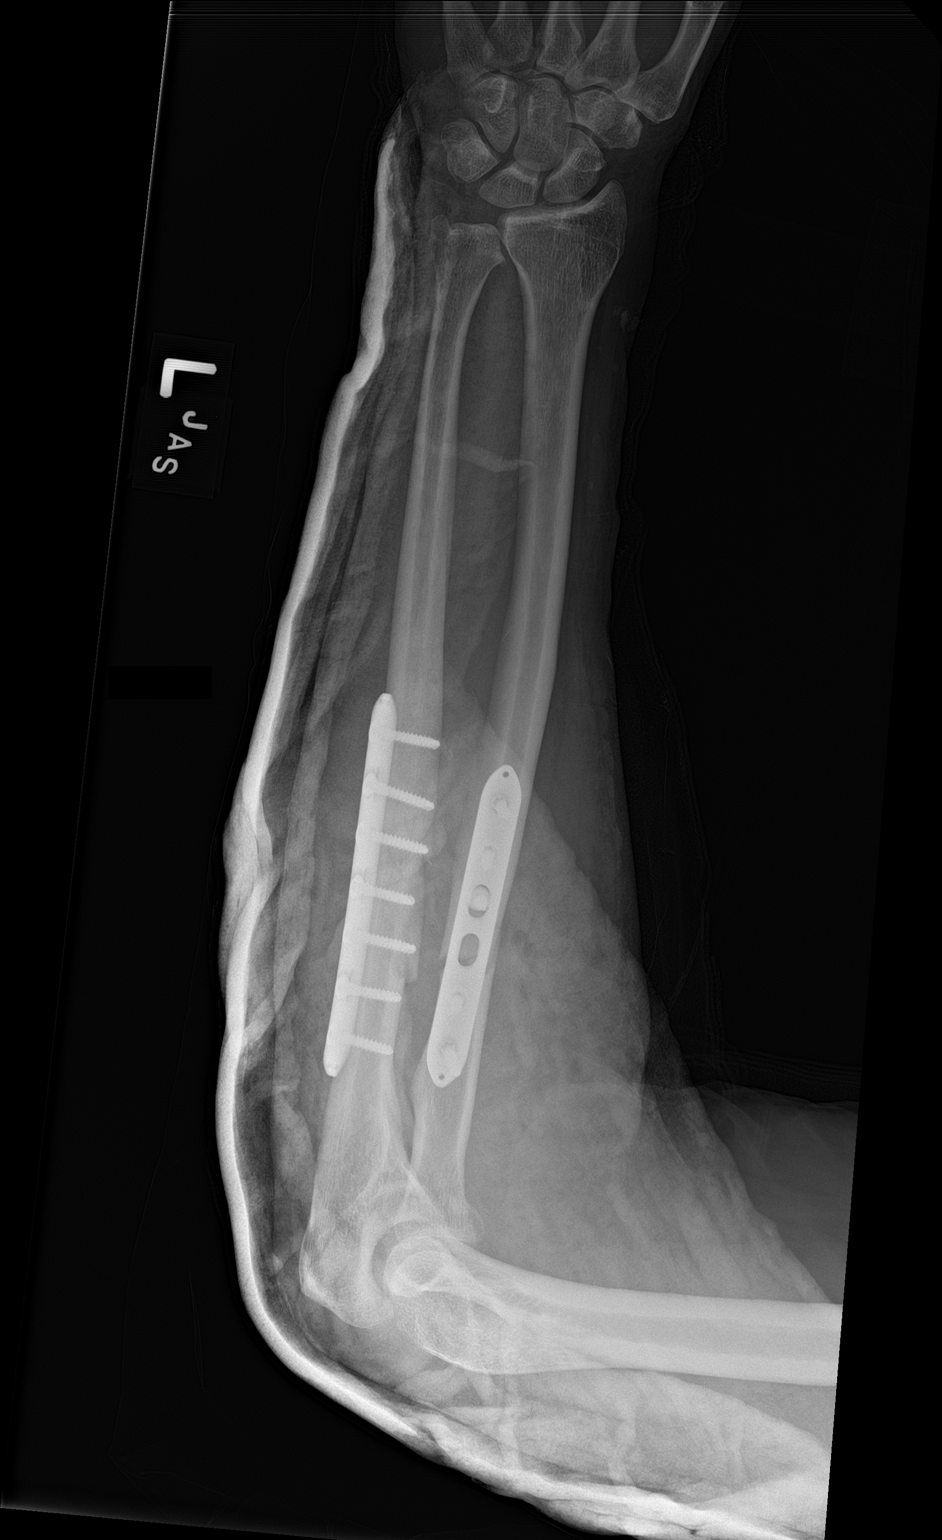
[im 2/2]
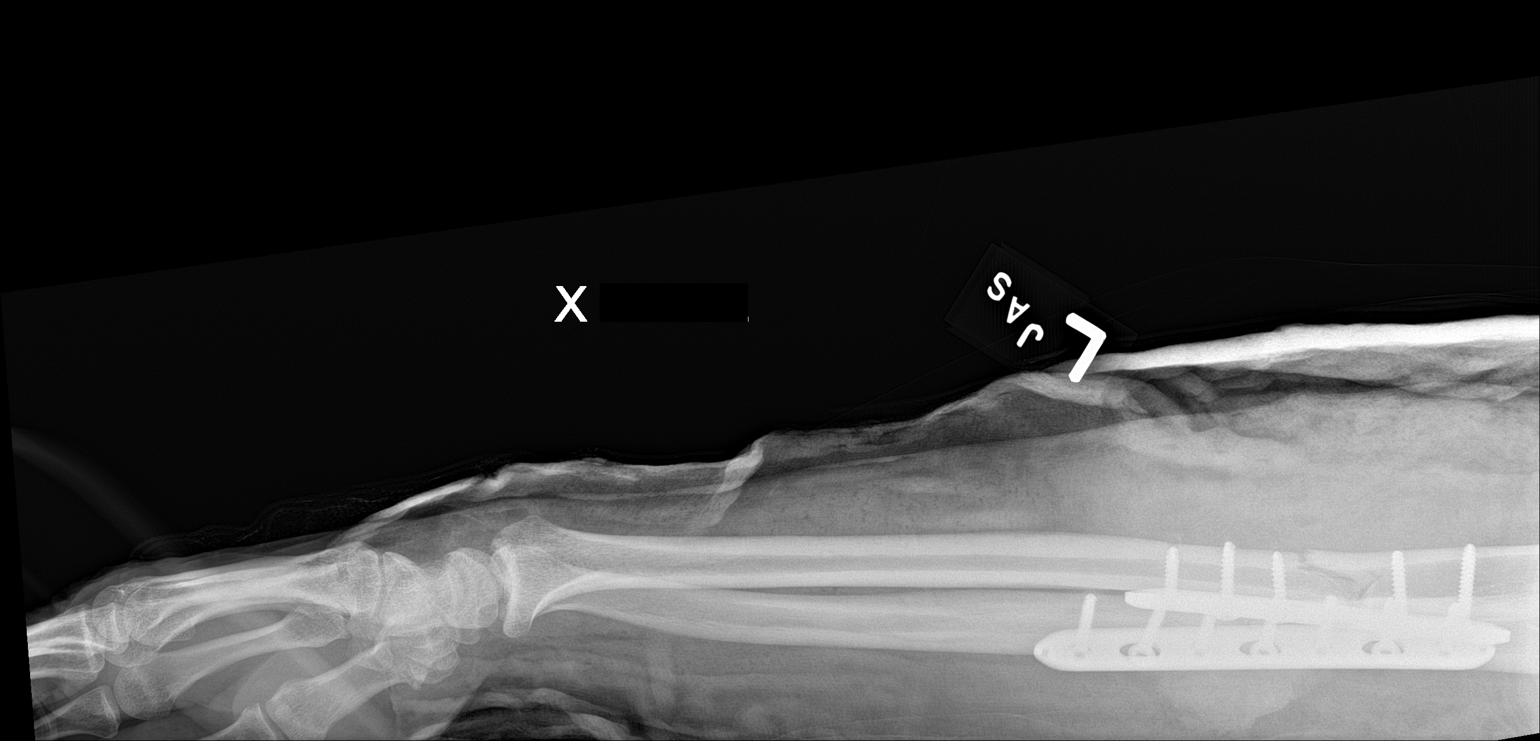

[2 of 2 positions shown; findings below may reference images not displayed]

FINDINGS: Fixation sideplate is now noted along the radius and ulna with
anatomic alignment of the fracture fragments. Some splinting
material is noted posteriorly. No other focal abnormality is noted.
IMPRESSION: Status post ORIF of radial and ulnar fractures on the left.

## 2020-08-17 IMAGING — CT CT OF THE LEFT FOREARM WITHOUT CONTRAST
1 series · 12 of 14 positions shown, 15 images · non-contrast
Comparison: None.

CLINICAL DATA: Forearm fracture 1 year ago.  Evaluate for healing.

EXAM:
CT OF THE LEFT FOREARM WITHOUT CONTRAST
TECHNIQUE: Multidetector CT imaging was performed according to the standard
protocol. Multiplanar CT image reconstructions were also generated.

[Series 3: ext soft (person_name) · axial · 0.35mm/px · z∈[+49,+345]mm · 12 of 172 slices shown, 15 images]
[im 14/172  soft-tissue]
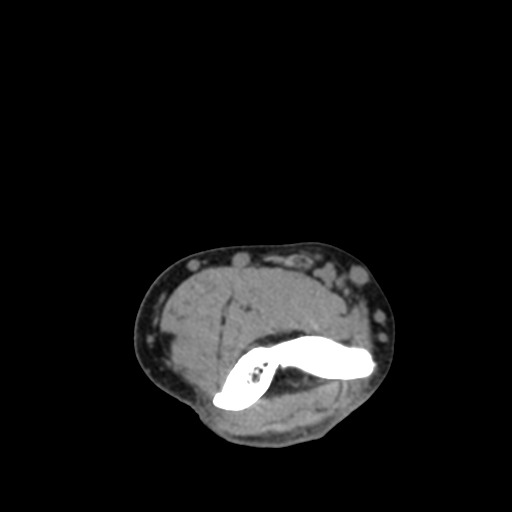
[im 14/172  bone]
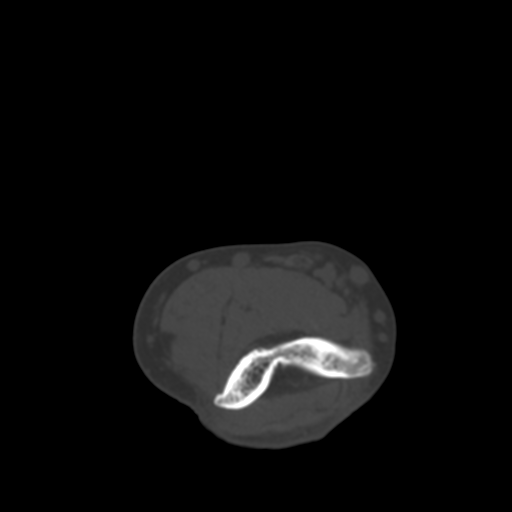
[im 27/172  bone]
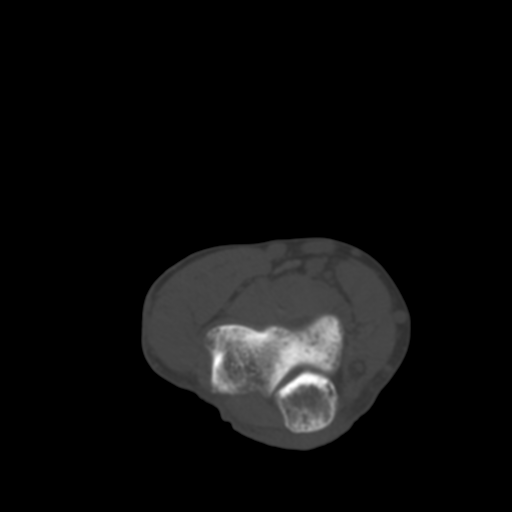
[im 40/172  bone]
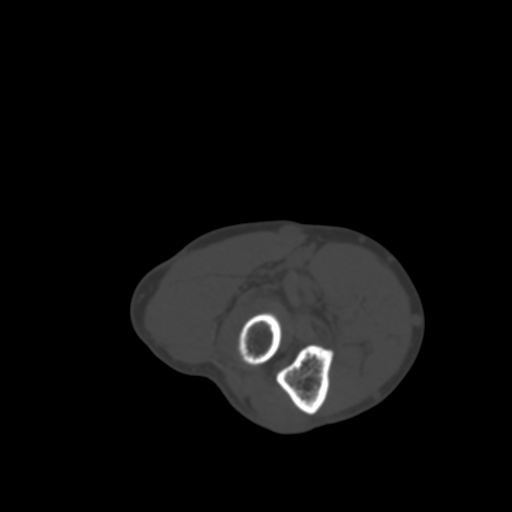
[im 53/172  bone]
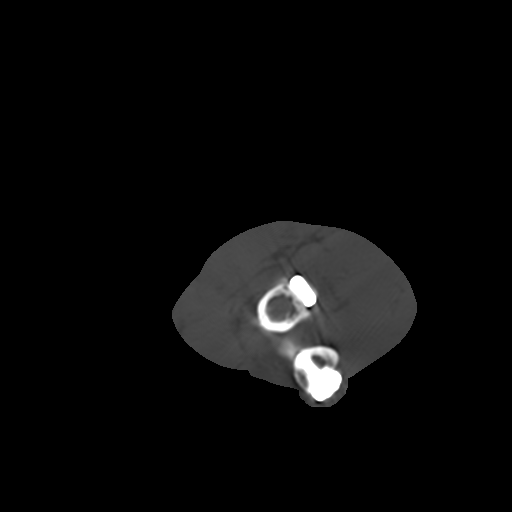
[im 66/172  soft-tissue]
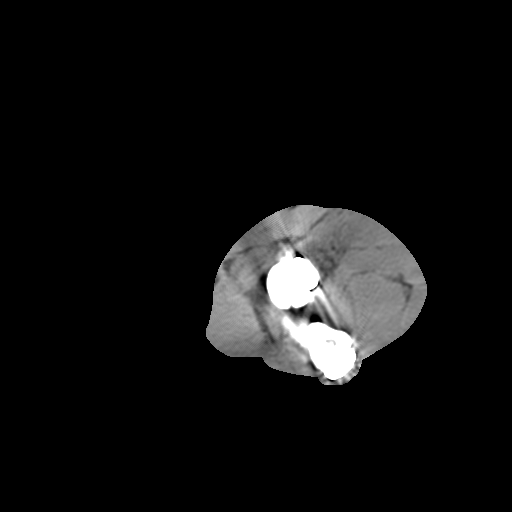
[im 66/172  bone]
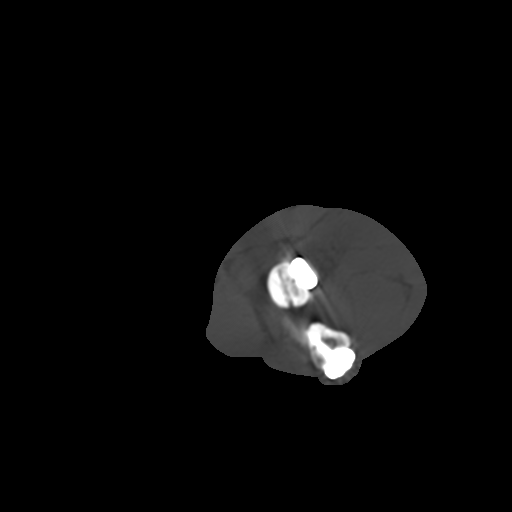
[im 79/172  bone]
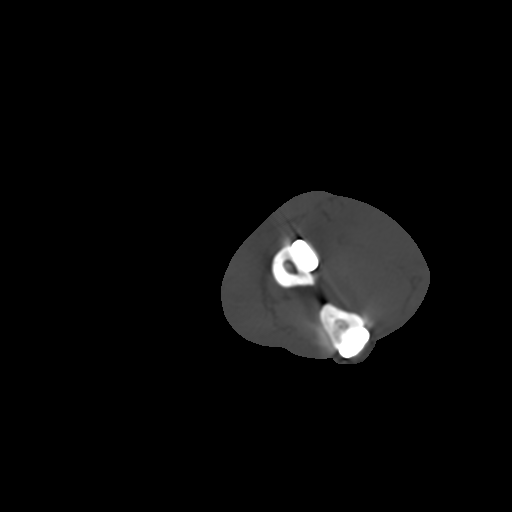
[im 93/172  bone]
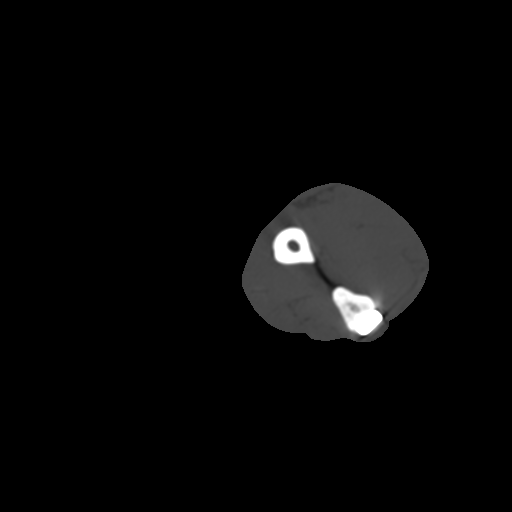
[im 106/172  bone]
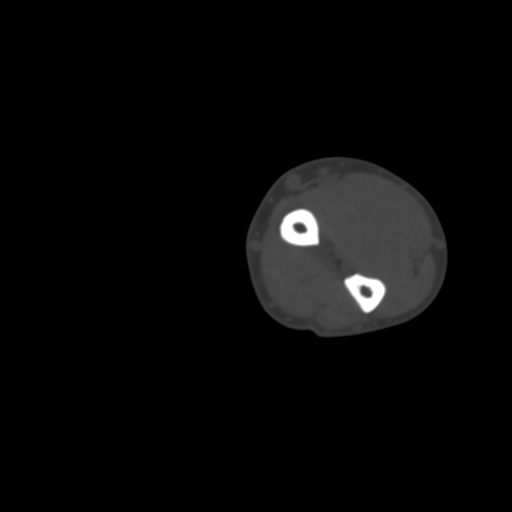
[im 119/172  soft-tissue]
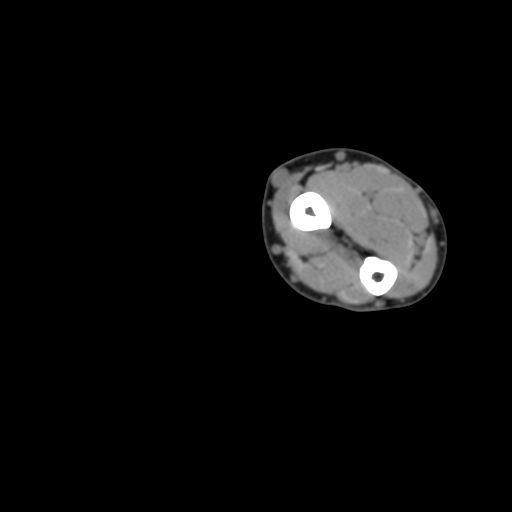
[im 119/172  bone]
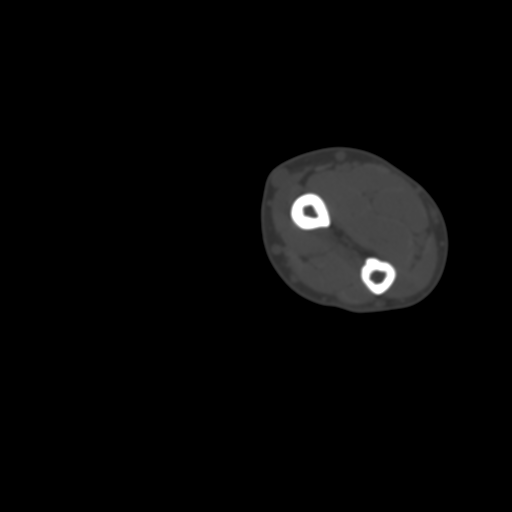
[im 132/172  bone]
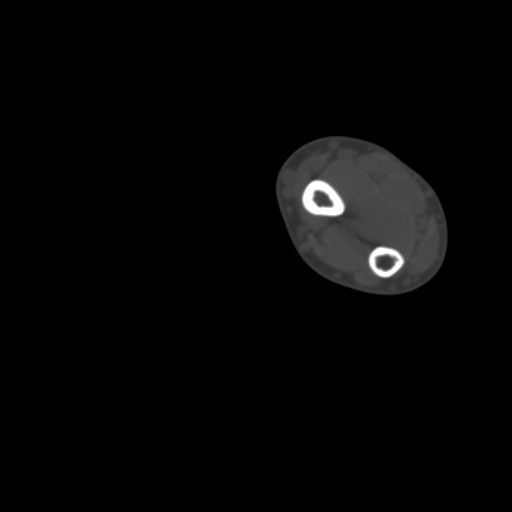
[im 145/172  bone]
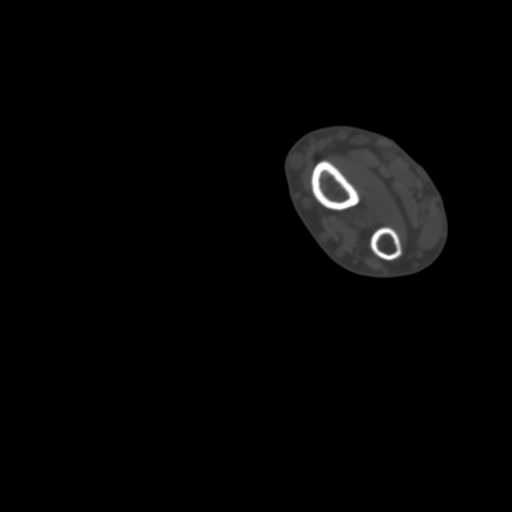
[im 158/172  bone]
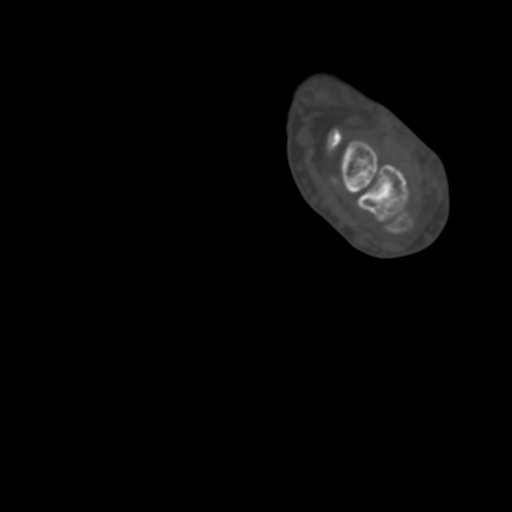

[12 of 14 positions shown; findings below may reference images not displayed]

FINDINGS: Bones/Joint/Cartilage

Nearly completely healed fracture of the proximal radial diaphysis
transfixed with a metallic sideplate and interlocking screws with a
small persistent fracture lucency along the peripheral most aspect.
Healed proximal ulnar diaphysis fracture transfixed with a metallic
sideplate and interlocking screws.

No other acute fracture or dislocation. No aggressive osseous
lesion. No periosteal reaction or bone destruction.

Ligaments

Suboptimally assessed by CT.

Muscles and Tendons

Muscles are normal. No muscle atrophy. No intramuscular fluid
collection or hematoma. Biceps tendon and triceps tendons are
intact.

Soft tissues

Soft tissues are normal. No intramuscular fluid collection or
hematoma.
IMPRESSION: Nearly completely healed fracture of the proximal radial diaphysis
transfixed with a metallic sideplate and interlocking screws with a
small persistent fracture lucency along the peripheral most aspect.

Healed proximal ulnar diaphysis fracture transfixed with a metallic
sideplate and interlocking screws.

## 2024-07-16 ENCOUNTER — Encounter: Payer: Self-pay | Admitting: Chiropractic Medicine

## 2024-09-13 ENCOUNTER — Ambulatory Visit (HOSPITAL_BASED_OUTPATIENT_CLINIC_OR_DEPARTMENT_OTHER): Admitting: Physical Therapy

## 2024-09-20 ENCOUNTER — Encounter (HOSPITAL_BASED_OUTPATIENT_CLINIC_OR_DEPARTMENT_OTHER): Admitting: Physical Therapy

## 2024-09-27 ENCOUNTER — Encounter (HOSPITAL_BASED_OUTPATIENT_CLINIC_OR_DEPARTMENT_OTHER): Admitting: Physical Therapy
